# Patient Record
Sex: Female | Born: 1940 | Race: White | Hispanic: No | Smoking: Former smoker
Health system: Southern US, Community
[De-identification: ages and names within clinical notes are randomized; demographics above are authoritative.]

## PROBLEM LIST (undated history)

## (undated) DIAGNOSIS — E039 Hypothyroidism, unspecified: Secondary | ICD-10-CM

## (undated) DIAGNOSIS — M545 Low back pain, unspecified: Secondary | ICD-10-CM

## (undated) DIAGNOSIS — R413 Other amnesia: Principal | ICD-10-CM

## (undated) DIAGNOSIS — L309 Dermatitis, unspecified: Secondary | ICD-10-CM

## (undated) DIAGNOSIS — M25562 Pain in left knee: Secondary | ICD-10-CM

## (undated) DIAGNOSIS — H409 Unspecified glaucoma: Secondary | ICD-10-CM

## (undated) DIAGNOSIS — G43909 Migraine, unspecified, not intractable, without status migrainosus: Secondary | ICD-10-CM

## (undated) HISTORY — DX: Dermatitis, unspecified: L30.9

## (undated) HISTORY — DX: Hypothyroidism, unspecified: E03.9

## (undated) HISTORY — PX: APPENDECTOMY: SHX54

## (undated) HISTORY — DX: Other amnesia: R41.3

## (undated) HISTORY — DX: Low back pain, unspecified: M54.50

## (undated) HISTORY — DX: Low back pain: M54.5

## (undated) HISTORY — DX: Pain in left knee: M25.562

## (undated) HISTORY — DX: Unspecified glaucoma: H40.9

## (undated) HISTORY — DX: Migraine, unspecified, not intractable, without status migrainosus: G43.909

---

## 1975-09-06 HISTORY — PX: TUBAL LIGATION: SHX77

## 1988-09-05 HISTORY — PX: DILATION AND CURETTAGE OF UTERUS: SHX78

## 1989-09-05 HISTORY — PX: OTHER SURGICAL HISTORY: SHX169

## 2013-03-31 ENCOUNTER — Other Ambulatory Visit: Payer: Self-pay

## 2013-03-31 MED ORDER — DONEPEZIL HCL 10 MG PO TABS: 10.0000 mg | ORAL_TABLET | Freq: Every day | ORAL | Status: DC

## 2013-03-31 NOTE — Telephone Encounter (Signed)
Former [facility] patient assigned to Dr Athar.  

## 2013-04-04 ENCOUNTER — Encounter: Payer: Self-pay | Admitting: Neurology

## 2013-04-04 ENCOUNTER — Ambulatory Visit (INDEPENDENT_AMBULATORY_CARE_PROVIDER_SITE_OTHER): Payer: Medicare Other | Admitting: Neurology

## 2013-04-04 VITALS — BP 152/78 | HR 47 | Ht 64.0 in | Wt 134.0 lb

## 2013-04-04 DIAGNOSIS — R413 Other amnesia: Secondary | ICD-10-CM

## 2013-04-04 HISTORY — DX: Other amnesia: R41.3

## 2013-04-04 MED ORDER — DONEPEZIL HCL 10 MG PO TABS: 10.0000 mg | ORAL_TABLET | Freq: Every day | ORAL | Status: DC

## 2013-04-04 NOTE — Patient Instructions (Addendum)
I think overall you are doing fairly well and are stable at this point.   I do have some generic suggestions for you today:  Please make sure that you drink plenty of fluids. I would like for you to exercise daily for example in the form of walking 20-30 minutes every day, if you can. Please keep a regular sleep-wake schedule, keep regular meal times, do not skip any meals, eat  healthy snacks in between meals, such as fruit or nuts. Try to eat protein with every meal.   As far as your medications are concerned, I would like to suggest:    As far as diagnostic testing, I recommend:   Engage in social activities in your community and with your family and try to keep up with current events by reading the newspaper or watching the news.  I do not think we need to make any changes in your medications at this point. Please call us if you have any interim questions, concerns, or problems or updates to need to discuss.  I suggest that you see one of my associates in about 6 months for routine follow up. I will have our nurse, Alverda Skeans, call you for your appointment.  Our phone number is [phone]. We also have an after hours call service for urgent matters and there is a physician on-call for urgent questions. For any emergencies you know to call 911 or go to the nearest emergency room.

## 2013-04-04 NOTE — Progress Notes (Signed)
Subjective:    Patient ID: Leslie Roach is a 72 y.o. female.  HPI  Interim history:   Leslie Roach is a very pleasant 72 year old RH woman, who Presents for followup consultation of her memory loss of over 5 years duration. She is unaccompanied today. She previously followed with Dr. Venia Carbon was last seen by him on 11/13/2012, which time he felt that she was stable she had an MMSE of 30 out of 30 at the time, clock drawing before and animal fluency of 10. She has an underlying medical history of migraine, glaucoma, cataracts, hypothyroidism, eczema, chronic low back pain and left knee pain status post left knee arthroscopic surgery in 1991. She's currently on levothyroxine, Citracal, donepezil, baby aspirin, Lumigan and dorzolamide. Her memory loss started about 5 years ago. 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 She has not had it done yet. She had a near fainting episode while gardening and saw cardiology. She is independent in her activities of daily living. She has no new complaints, she stays active, she lives alone and drives without problems reported. Left knee is much better since her surgery in 1991.   Her Past Medical History Is Significant For: Past Medical History  Diagnosis Date  . Glaucoma   . Hypothyroidism   . Eczema   . Lower back pain   . Left knee pain   . Memory loss 04/04/2013    Her Past Surgical History Is Significant For: Past Surgical History  Procedure Laterality Date  . Dilation and curettage of uterus  1990  . Arthroscopic surgery knee  1991    Her Family History Is Significant For: Family History  Problem Relation Age of Onset  . Cancer Mother   . Cancer Sister   . Cancer Brother     Her Social History Is Significant For: History   Social History  .  Marital Status: Married    Spouse Name: N/A    Number of Children: N/A  . Years of Education: N/A   Social History Main Topics  . Smoking status: None  . Smokeless tobacco: None  . Alcohol Use: None  . Drug Use: None  . Sexually Active: None   Other Topics Concern  . None   Social History Narrative   She lives at home alone.  She is a Engineer, maintenance (IT).  Her husband is deceased.  She has 3 children ,son's 47 and 35 and a daughter 57.  She quit tobacco in 1970.  She consumes 3-4 glasses of red wine per week.  She denies illicit drug use.  She consumes 2  mugs of caffeine in the AM.      Her Allergies Are:  No Known Allergies:   Her Current Medications Are:  Outpatient Encounter Prescriptions as of 04/04/2013  Medication Sig Dispense Refill  . aspirin 81 MG tablet Take 81 mg by mouth daily.      . bimatoprost (LUMIGAN) 0.01 % SOLN 1 drop at bedtime. One drop in each eye once a day      . Calcium Citrate (CITRACAL PO) Take by mouth 2 (two) times daily. Take  2 tabs two times daily      . donepezil (ARICEPT) 10 MG tablet Take 1 tablet (10 mg total) by mouth daily.  30 tablet  0  . dorzolamide (TRUSOPT)  2 % ophthalmic solution Place 1 drop into both eyes 2 (two) times daily.      Marland Kitchen levocetirizine (XYZAL) 5 MG tablet Take 5 mg by mouth daily. Take one tab daily      . levothyroxine (SYNTHROID, LEVOTHROID) 25 MCG tablet Take 25 mcg by mouth daily before breakfast.       No facility-administered encounter medications on file as of 04/04/2013.  : Review of Systems  HENT: Positive for hearing loss.   Eyes:       Blurred Vision  Musculoskeletal:       Joint pain  Allergic/Immunologic: Positive for environmental allergies.  Psychiatric/Behavioral:       Memory Loss,    Objective:  Neurologic Exam  Physical Exam Physical Examination:   Filed Vitals:   04/04/13 0951  BP: 152/78  Pulse: 47    General Examination: The patient is a very pleasant 72 y.o. female in no acute  distress. She is calm and cooperative with the exam. She denies Auditory Hallucinations and Visual Hallucinations.   HEENT: Normocephalic, atraumatic, pupils are equal, round and reactive to light and accommodation. Funduscopic exam is normal with sharp disc margins noted. Extraocular tracking shows no saccadic breakdown without nystagmus noted. Hearing is intact. Tympanic membranes are clear bilaterally. Face is symmetric with no facial masking and normal facial sensation. There is no lip, neck or jaw tremor. Neck is not rigid with intact passive ROM. There are no carotid bruits on auscultation. Oropharynx exam reveals mild mouth dryness. No significant airway crowding is noted. Mallampati is class II. Tongue protrudes centrally and palate elevates symmetrically.    Chest: is clear to auscultation without wheezing, rhonchi or crackles noted.  Heart: sounds are regular and normal without murmurs, rubs or gallops noted.   Abdomen: is soft, non-tender and non-distended with normal bowel sounds appreciated on auscultation.  Extremities: There is trace pitting edema in the distal lower extremities bilaterally. Pedal pulses are intact.  Skin: is warm and dry with no trophic changes noted.  Musculoskeletal: exam reveals no obvious joint deformities, tenderness or joint swelling or erythema.  Neurologically:  Mental status: The patient is awake and alert, paying good  attention. She is able to completely provide the history. She is oriented to: person, place, time/date, situation, day of week, month of year and year. Her memory, attention, language and knowledge are fairly well preserved. There is no aphasia, agnosia, apraxia or anomia. There is a no significant degree of bradyphrenia. Speech is not hypophonic with no dysarthria noted. Mood is congruent and affect is normal.  Her MMSE score is 29/30. CDT is 4/4. AFT (Animal Fluency Test) score is 12.   Cranial nerves are as described above under HEENT  exam. In addition, shoulder shrug is normal with equal shoulder height noted.  Motor exam: Normal bulk, and strength for age is noted. Tone is not rigid with absence of cogwheeling. There is overall no bradykinesia. There is no drift or rebound. There is no tremor.   Romberg is negative. Reflexes are 1+ in the upper extremities and 1+ in the lower extremities. Toes are downgoing bilaterally. Fine motor skills: Finger taps, hand movements, and rapid alternating patting are not impaired bilaterally. Foot taps and foot agility are not impaired bilaterally.   Cerebellar testing shows no dysmetria or intention tremor on finger to nose testing. Heel to shin is unremarkable. There is no truncal or gait ataxia.   Sensory exam is intact to light touch, pinprick, vibration, temperature sense and  proprioception in the upper and lower extremities.   Gait, station and balance: She stands up from the seated position with no difficulty and does not need to push up with her hands and needs no assistance. No veering to one side is noted. No leaning to one side. Posture is age-appropriate. Stance is narrow-based. She turns en bloc. Tandem walk is fair. Balance is preserved.   Assessment and Plan:   In summary, Leslie Roach is a very pleasant 72 y.o.-year old female with a 5 + year history of memory loss. Her history and physical exam are keeping with MCI (mild cognitive impairment). Her physical exam and MMSE/CDT/AFT scores are stable and the disease has not progressed in the last 4+ months. She is doing fairly well at this time and I reassured the patient in that regard.  I had a long chat with the patient about my findings and the diagnosis of memory loss, its prognosis and treatment options. We talked about medical treatments and non-pharmacological approaches. We talked about maintaining a healthy lifestyle in general and staying active mentally and physically. She is encouraged to start doing word puzzles and  to look into [email or web address] and I encouraged the patient to eat healthy, exercise daily and keep well hydrated, to keep a scheduled bedtime and wake time routine, to not skip any meals and eat healthy snacks in between meals and to have protein with every meal. I stressed the importance of regular exercise, within of course the patient's own limitations. I encouraged the patient to keep up with current events by reading the news paper or watching the news.   As far as further diagnostic testing is concerned, I suggested the following: no change. We can repeat neurocognitive testing down the road.  As far as medications are concerned, I recommended the following at this time: no change.  I suggest that she follow up with one of my associates in about 6 months routinely, sooner if the need arises. I will have our nurse, Alverda Skeans, call her for the appointment. I answered all her questions today and the patient was in agreement with the above outlined plan. I encouraged her to call with any interim questions, concerns, problems, updates and refill requests.

## 2013-04-08 ENCOUNTER — Telehealth: Payer: Self-pay | Admitting: Neurology

## 2013-07-06 HISTORY — PX: GLAUCOMA SURGERY: SHX656

## 2013-07-06 HISTORY — PX: CATARACT EXTRACTION: SUR2

## 2013-08-08 ENCOUNTER — Telehealth: Payer: Self-pay | Admitting: Neurology

## 2013-08-08 NOTE — Telephone Encounter (Signed)
Attempted to call pt to r/s 2/12 appt per Dr. Oliva Bustard schedule but pt unable to be reached. Phone kept ringing and no voicemail option.

## 2013-08-12 ENCOUNTER — Telehealth: Payer: Self-pay | Admitting: Neurology

## 2013-08-12 NOTE — Telephone Encounter (Signed)
Spoke with patient and informed to call the cardiologist first since he discont. donezepril because of low heart rate, and that the message will be sent to Dr Frances Furbish for her suggestions,patient verbalized understanding and will call back after speaking with cardiologist.

## 2013-08-13 NOTE — Telephone Encounter (Signed)
Patient is scheduled for holter monitor 12/17, will call back with results

## 2013-10-04 ENCOUNTER — Encounter: Payer: Self-pay | Admitting: Neurology

## 2013-10-17 ENCOUNTER — Encounter (INDEPENDENT_AMBULATORY_CARE_PROVIDER_SITE_OTHER): Payer: Self-pay

## 2013-10-17 ENCOUNTER — Encounter: Payer: Self-pay | Admitting: Neurology

## 2013-10-17 ENCOUNTER — Ambulatory Visit (INDEPENDENT_AMBULATORY_CARE_PROVIDER_SITE_OTHER): Payer: Medicare Other | Admitting: Neurology

## 2013-10-17 VITALS — BP 132/74 | HR 56 | Resp 18 | Ht 64.5 in | Wt 136.0 lb

## 2013-10-17 DIAGNOSIS — F4320 Adjustment disorder, unspecified: Secondary | ICD-10-CM | POA: Insufficient documentation

## 2013-10-17 DIAGNOSIS — F4321 Adjustment disorder with depressed mood: Secondary | ICD-10-CM

## 2013-10-17 NOTE — Progress Notes (Signed)
Subjective:    [name]  Provider:  [name], M D  Referring Provider: [name], MD Primary Care Physician:  [name], MD  Chief Complaint  Patient presents with  . Follow-up    Room 11  . Memory Loss    MMSE- 29/30 AFT - 13    HPI:  Leslie Roach is a 73 y.o. female  Is seen here as a referral/ revisit  from [name] for memory deficits, subjective : The patient again was tested today for her subjective concerned of memory deficits. Since in the past Mini-Mental status examinations have been used to trace her cognitive abilities that this test was repeated today. She scored 29/30 points for order for clock drawing animal fluency test was 12. She is socializing, sleeping well, one of her sons is a physician -- oral, another son lives in [place]. She has good social contacts and a fulfilled social life. The patient lost her husband about 6 years ago and it was in the time after that her sons noticed delay naming and word recall. The patient had no history of any trauma,  prolonged surgery or anesthetic use.  She is a very moderate drinker  @5  glasses of wine per week (at the maximum) , her mother died at age 66 she has a sister that at 49 had no specific memory problems but is obese and has limited mobility. [name] now at 73 years of age has scored higher on her memory testing than she did in 2013, 2012! She is taking generic Aricept daly at 10 mg po since Dr Erling Cruz prescribed this medication in 2010 . Her cardiologist was concerned about bradycardia and discussed a reduction. He would prefer 5 mg daily. The patient wants this addressed.       Last visit history : [name] is a very pleasant 73 year old RH woman, who Presents for followup consultation of her memory loss of over 5 years duration. She is unaccompanied today. She previously followed with [name] was last seen by him on 11/13/2012, which time he felt that she was stable  she had an MMSE of 30 out of 30 at the time, clock drawing before and animal fluency of 10. She has an underlying medical history of migraine, glaucoma, cataracts, hypothyroidism, eczema, chronic low back pain and left knee pain status post left knee arthroscopic surgery in 1991. She's currently on levothyroxine, Citracal, donepezil, baby aspirin, Lumigan and dorzolamide. Her memory loss started about 5 years ago. 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 She has not had it  done yet. She had a near fainting episode while gardening and saw cardiology. She is independent in her activities of daily living. She has no new complaints, she stays active, she lives alone and drives without problems reported. Left knee is much better since her surgery in 1991.      Review of Systems: Out of a complete 14 system review, the patient complains of only the following symptoms, and all other reviewed systems are negative. The patient endorsed a tendency to easily bruised and easily bleed, a complaint of memory loss, delayed naming or word finding. A history of skin sensitivity and allergies occasional rashes and some hearing loss.  History   Social History  . Marital Status: Widowed  Spouse Name: N/A    Number of Children: 3  . Years of Education: college   Occupational History  . Not on file.   Social History Main Topics  . Smoking status: Former Research scientist (life sciences)  . Smokeless tobacco: Never Used     Comment: 1970  . Alcohol Use: Yes     Comment: 3-4 glasses of wine  . Drug Use: No  . Sexual Activity: Not on file   Other Topics Concern  . Not on file   Social History Narrative   She lives at home alone.  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  She has 3 children ,son's 38 and 87 and a daughter 25.  She quit  tobacco in 1970.  She consumes 3-4 glasses of red wine per week.  She denies illicit drug use.  She consumes 2  mugs of caffeine in the AM.      Family History  Problem Relation Age of Onset  . Colon cancer Mother   . Cancer Sister   . Cancer Brother   . Congestive Heart Failure Mother   . Heart Problems Maternal Grandfather   . Heart Problems Maternal Grandmother   . Cancer      paternal siblings  . Cancer Brother     Past Medical History  Diagnosis Date  . Glaucoma   . Hypothyroidism   . Eczema   . Lower back pain   . Left knee pain   . Memory loss 04/04/2013  . Migraine     Past Surgical History  Procedure Laterality Date  . Dilation and curettage of uterus  1990  . Arthroscopic surgery knee  1991  . Tubal ligation  1977  . Appendectomy      childhood  . Cataract extraction  07/2013  . Glaucoma surgery  07/2013    Current Outpatient Prescriptions  Medication Sig Dispense Refill  . aspirin 81 MG tablet Take 81 mg by mouth daily.      . bimatoprost (LUMIGAN) 0.01 % SOLN 1 drop at bedtime. One drop in each eye once a day      . Calcium Citrate (CITRACAL PO) Take by mouth 2 (two) times daily. Take  2 tabs two times daily      . diltiazem (CARDIZEM) 120 MG tablet Take 120 mg by mouth daily.      Marland Kitchen donepezil (ARICEPT) 5 MG tablet Take 5 mg by mouth at bedtime.      . dorzolamide (TRUSOPT) 2 % ophthalmic solution Place 1 drop into both eyes 2 (two) times daily.      Marland Kitchen levocetirizine (XYZAL) 5 MG tablet Take 5 mg by mouth daily. Take one tab daily      . levothyroxine (SYNTHROID, LEVOTHROID) 25 MCG tablet Take 25 mcg by mouth daily before breakfast.       No current facility-administered medications for this visit.    Allergies as of 10/17/2013  . (No Known Allergies)    Vitals: BP 132/74  Pulse 56  Resp 18  Ht 5' 4.5" (1.638 m)  Wt 136 lb (61.689 kg)  BMI 22.99 kg/m2 Last Weight:  Wt Readings from Last 1 Encounters:  10/17/13 136 lb (61.689 kg)   Last  Height:   Ht Readings from Last 1 Encounters:  10/17/13 5' 4.5" (1.638 m)    General: The patient is awake, alert and appears not in acute distress. The patient is well groomed. Head: Normocephalic, atraumatic. Neck is supple. Mallampati 2, neck circumference:13.5  Cardiovascular:  Regular rate and rhythm , without  murmurs or carotid bruit, and without distended neck veins. Respiratory: Lungs are clear to auscultation. Skin:  Without evidence of edema, or rash Trunk: BMI is normal .   Neurologic exam : The patient is awake and alert, oriented to place and time.  Memory subjective  described as "delayed ", which is not reflected in  her MMSE of 29-30 , AFT of 13 and clock of 4/4 .  There is a normal attention span & concentration ability. She is pleasant and cooperative , fully oriented and well informed of daily news.  Speech is fluent without dysarthria, dysphonia or aphasia. Mood and affect are appropriate.  Cranial nerves: Pupils are equal and briskly reactive to light. Funduscopic exam without evidence of pallor or edema. Extraocular movements  in vertical and horizontal planes intact and without nystagmus. Visual fields by finger perimetry are intact. Hearing to finger rub intact.  Facial sensation intact to fine touch. Facial motor strength is symmetric and tongue and uvula move midline.  Motor exam:   Normal tone and  muscle bulk and symmetric strength in all extremities.  Sensory:  Fine touch, pinprick and vibration were tested in all extremities. Proprioception is  normal.  Coordination: Rapid alternating movements in the fingers/hands is tested and normal. Finger-to-nose maneuver tested and normal without evidence of ataxia, dysmetria or tremor.  Gait and station: Patient walks without assistive device  Strength within normal limits. Stance is stable and normal. Tandem gait isunfragmented. Romberg testing: normal.  Deep tendon reflexes: in the  upper and lower extremities are  symmetric and intact. Babinski maneuver downgoing.   Assessment:  After physical and neurologic examination, review of laboratory studies, imaging, neurophysiology testing and pre-existing records, assessment :  Mrs. Heinert is Mini-Mental Status Examination reveals no sign of dementia, at best a mild cognitive impairment today. She scored 29/30 points, for animal fluency test scored 13 and clock drawing is 4/4. I would like in the future for her to undergo a Montral cognitive assessment as which is more detailed and difficult and may be able to differentiate several cognitive domains.  She appears not depressed and indeed her geriatric  depression score is endorsed at 0 point.   Overall I think that we should continue seeing her once a year for a memory test. There is no reason to order any additional laboratory testing and no imaging study is required.  As to the generic Aricept , I see that bradycardia could be an issue : at the patient's last visit listed a pulse rate of 47 beats per minute.  I would like for her to change to aricept  5 mg twice a day about 12 hours apart instead of 10 mg once a day.  This may help her to maintain a pulse rate of 50. The patient   reports no fainting, falling or lightheadedness and has no physical complaints related to bradycardia.   I will encourage her cardiologist, Dr. Agustin Cree ,  to contact me if this is not sufficient in his opinion.  I know of one episode N. 2013 and the patient had felt faint he while gardening, but at that time she was found to be dehydrated in the heat and had also not eaten.   Plan:  Treatment plan and additional workup will be reviewed under Problem List.  MILD COGNITIVE IMPAIRMENT- the conversation rate to dementia is 7-10% per year- the patient has been followed for this for over 4  years .  Continue aricept at 5 mg q 12 hours.

## 2013-12-05 NOTE — Telephone Encounter (Signed)
Closing encounter

## 2014-07-23 ENCOUNTER — Encounter: Payer: Self-pay | Admitting: Neurology

## 2014-10-17 ENCOUNTER — Encounter: Payer: Self-pay | Admitting: Neurology

## 2014-10-17 ENCOUNTER — Ambulatory Visit (INDEPENDENT_AMBULATORY_CARE_PROVIDER_SITE_OTHER): Payer: Medicare Other | Admitting: Neurology

## 2014-10-17 ENCOUNTER — Ambulatory Visit: Payer: Medicare Other | Admitting: Nurse Practitioner

## 2014-10-17 VITALS — BP 125/74 | HR 48 | Resp 14 | Ht 65.25 in | Wt 136.0 lb

## 2014-10-17 DIAGNOSIS — F329 Major depressive disorder, single episode, unspecified: Secondary | ICD-10-CM

## 2014-10-17 DIAGNOSIS — G3184 Mild cognitive impairment, so stated: Secondary | ICD-10-CM

## 2014-10-17 DIAGNOSIS — F32A Depression, unspecified: Secondary | ICD-10-CM

## 2014-10-17 DIAGNOSIS — R5383 Other fatigue: Secondary | ICD-10-CM

## 2014-10-17 NOTE — Progress Notes (Signed)
Subjective:    [name]  Provider:  [name], M D  Referring Provider: [name], MD Primary Care Physician:  [name], MD  Chief Complaint  Patient presents with  . RV cpap    Rm 10, alone    HPI:  Leslie Roach is a 74 y.o. female  Is seen here as a referral/ revisit  from [name] for memory deficits, subjective :  Leslie Roach is a very pleasant 74 year old right handed  Female patient who presents for followup of her memory loss compliant  of over 5 years . She is unaccompanied today. She previously followed with [name] was last seen by him on 11/13/2012, which time he felt that she was stable she had an MMSE of 30 out of 30 at the time, clock drawing before and animal fluency of 10. The patient again was tested 2-12-16for her subjective concerned of memory deficits.  . She scored 29/30 points, on MMSE , for clock drawing  4/4 , animal fluency test was 12.  A MOCA was performed after wards. She scored 23 out of 30 points on this more detailed memory test . Interesting is that she can truly perform well on the most complicated of tasks  (visual spatial and executive function)  as well as attention are excellent.  She did  Fail abt struction, reversing numbers in the right sequence.  But she missed all 5 of the delayed recall words. This is a progression in comparison to her last test.  She is socializing, sleeping well, one of her sons is a physician -- locally in [place],  another son lives in [place]. She has good social contacts and a fulfilled social life. The patient lost her husband about 6 years ago and it was in the time after that her sons noticed delay naming and word recall. The patient had no history of any trauma,  prolonged surgery or anesthetic use. She is a very moderate drinker  @3   glasses of wine per week (at the maximum) ,  her mother died at age 71 she has a sister that at 66 had no specific memory problems but is  obese and has limited mobility. Leslie Roach now at 74 years of age had scored higher on her memory testing in 2015 than she did in 2013, 2012!  She now has the first "slump" while tested onm a MOCA, which is more detailed. She is taking generic Aricept daly at 5 mg po  - Dr Erling Cruz prescribed this medication in 2010 at 10 mg .  Her cardiologist was concerned about bradycardia and discussed a reduction to 5 mg daily, we decided  Today on 5 mg bid.  This is based on a reduction in the score to 23 out of 30 points and is slightly below what is expected for adding's level of education and socioeconomic status. In addition I think that 5 mA twice a day will be easier to tolerate a for bradycardia patient then 10 mg at once I would like her to take 1 in the morning one at night. I wanted to add here that the patient had a CT scan in August 2008 which showed mild cerebral atrophy. She had mild cognitive impairment in her neuropsychological testing battery performed November 2012. And she lives alone she has been driving without problems.    Review of Systems: Out of a complete 14 system review, the patient complains of only the following symptoms, and  all other reviewed systems are negative. The patient endorsed a tendency to easily bruised and easily bleed, a complaint of memory loss, delayed naming or word finding. A history of skin sensitivity and allergies occasional rashes and some hearing loss. She reports no fainting , she takes topical creams for procainamide and ammonium lactate cream and a vitamin D supplement for bone health.  She remains on Synthroid,  Aricept, Cardizem and aspirin, various eye drops.     History   Social History  . Marital Status: Widowed    Spouse Name: N/A  . Number of Children: 3  . Years of Education: college   Occupational History  . Not on file.   Social History Main Topics  . Smoking status: Former Smoker    Quit date: 09/06/1971  . Smokeless tobacco: Never  Used     Comment: 1970  . Alcohol Use: 0.0 oz/week    0 Standard drinks or equivalent per week     Comment: 3-4 glasses of wine/ wk  . Drug Use: No  . Sexual Activity: Not on file   Other Topics Concern  . Not on file   Social History Narrative   She lives at home alone.  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  She has 3 children ,son's 81 and 76 and a daughter 38.  She quit tobacco in 1970.  She consumes 3-4 glasses of red wine per week.  She denies illicit drug use.  She consumes 2  mugs of caffeine in the AM.      Family History  Problem Relation Age of Onset  . Colon cancer Mother   . Cancer Sister   . Cancer Brother   . Congestive Heart Failure Mother   . Heart Problems Maternal Grandfather   . Heart Problems Maternal Grandmother   . Cancer      paternal siblings  . Cancer Brother     Past Medical History  Diagnosis Date  . Glaucoma   . Hypothyroidism   . Eczema   . Lower back pain   . Left knee pain   . Memory loss 04/04/2013  . Migraine     Past Surgical History  Procedure Laterality Date  . Dilation and curettage of uterus  1990  . Arthroscopic surgery knee  1991  . Tubal ligation  1977  . Appendectomy      childhood  . Cataract extraction  07/2013  . Glaucoma surgery  07/2013    Current Outpatient Prescriptions  Medication Sig Dispense Refill  . ammonium lactate (AMLACTIN) 12 % cream Apply topically 2 (two) times daily as needed.   20  . aspirin 81 MG tablet Take 81 mg by mouth daily.    . bimatoprost (LUMIGAN) 0.01 % SOLN 1 drop at bedtime. One drop in each eye once a day    . Calcium Citrate (CITRACAL PO) Take by mouth 2 (two) times daily. Take  2 tabs two times daily    . cholecalciferol (VITAMIN D) 1000 UNITS tablet Take 1,000 Units by mouth daily.    Marland Kitchen diltiazem (CARDIZEM) 120 MG tablet Take 120 mg by mouth daily.    Marland Kitchen donepezil (ARICEPT) 5 MG tablet Take 5 mg by mouth at bedtime.    . dorzolamide (TRUSOPT) 2 % ophthalmic solution  Place 1 drop into both eyes 2 (two) times daily.    Marland Kitchen levocetirizine (XYZAL) 5 MG tablet Take 5 mg by mouth daily. Take one tab daily    .  levothyroxine (SYNTHROID, LEVOTHROID) 25 MCG tablet Take 25 mcg by mouth daily before breakfast.    . UNABLE TO FIND Med Name: Flucinomide cream .05% 2-3 x prn     No current facility-administered medications for this visit.    Allergies as of 10/17/2014  . (No Known Allergies)    Vitals: BP 125/74 mmHg  Pulse 45  Resp 14  Ht 5' 5.25" (1.657 m)  Wt 136 lb (61.689 kg)  BMI 22.47 kg/m2 Last Weight:  Wt Readings from Last 1 Encounters:  10/17/14 136 lb (61.689 kg)   Last Height:   Ht Readings from Last 1 Encounters:  10/17/14 5' 5.25" (1.657 m)    General: The patient is awake, alert and appears not in acute distress. The patient is well groomed. Head: Normocephalic, atraumatic. Neck is supple. Mallampati 2, neck circumference:13.5  Cardiovascular:  Regular rate and rhythm , without  murmurs or carotid bruit, and without distended neck veins. Respiratory: Lungs are clear to auscultation. Skin:  Without evidence of edema, or rash Trunk: BMI is normal.   Neurologic exam : The patient is awake and alert, oriented to place and time.  Memory subjective  described as "delayed ", which is not reflected in  her MMSE of 29-30, MOCA 23 -30 , AFT of 13 and clock of 4/4 .   There is a normal attention span & concentration ability. She has trouble with abstraction, not with trail making. Is able to drive according to her own assessment.   She is pleasant and cooperative , fully oriented and well informed of daily news.  Speech is fluent without dysarthria, dysphonia or aphasia.  Mood and affect are appropriate.  Cranial nerves: Pupils are equal and briskly reactive to light. Extraocular movements in vertical and horizontal planes intact and without nystagmus. Visual fields by finger perimetry are intact. Hearing to finger rub intact. Facial  sensation intact to fine touch. Facial motor strength is symmetric and tongue and uvula move midline.  Motor exam:   Normal tone and  muscle bulk and symmetric strength in all extremities. Sensory:  Fine touch, pinprick and vibration were tested in all extremities. Proprioception is  normal. Coordination: Rapid alternating movements in the fingers/hands is tested and normal. Finger-to-nose maneuver tested and normal without evidence of ataxia, dysmetria or tremor. Gait and station: Patient walks without assistive device  Strength within normal limits..  Deep tendon reflexes: in the  upper and lower extremities are symmetric and intact.    Assessment:  After physical and neurologic examination, review of laboratory studies, imaging, neurophysiology testing and pre-existing records, assessment :  Leslie Roach [facility]   Examination reveals early sign of dementia, . She scored 23/30 points, for animal fluency test scored 13 and clock drawing is 4/4. She has never been evaluated by a MOCA before, her MMSE remained non indicative at 29-30. She is forgetful, but able to balance her checkbook, she feels she overlooks things, has trouble to remember the shopping list.   Overall I think that we should continue seeing her twice  a year for a  [place] memory test. There is no reason to order any additional laboratory testing and but a new  imaging study is order/ required.  As to the generic Aricept , I see that bradycardia could be an issue : at the patient's last visit listed a pulse rate of 47 beats per minute.  I would like for her to change to aricept  5 mg twice a day about 12  hours apart instead of 5 mg once a day.  This may help her to maintain a pulse rate of 50.  It was only 48 bpm today, regular. She reports feeling fatigued and naps now in daytime.  The patient  reports no fainting, falling or lightheadedness and has no physical complaints related to bradycardia. Next visit will need epworth and  FSS> the patient doesn't snore, but lives alone.   I will encourage her cardiologist, Dr. Agustin Cree,  to contact me if this is not sufficient in his opinion-  Please call my office at [phone] or [phone].   Sincerely,   [name], MD

## 2015-04-20 ENCOUNTER — Ambulatory Visit: Payer: Medicare Other | Admitting: Neurology

## 2015-04-27 ENCOUNTER — Ambulatory Visit (INDEPENDENT_AMBULATORY_CARE_PROVIDER_SITE_OTHER): Payer: Medicare Other | Admitting: Neurology

## 2015-04-27 ENCOUNTER — Encounter: Payer: Self-pay | Admitting: Neurology

## 2015-04-27 VITALS — BP 130/80 | HR 68 | Resp 20 | Ht 65.35 in | Wt 132.0 lb

## 2015-04-27 DIAGNOSIS — G3184 Mild cognitive impairment, so stated: Secondary | ICD-10-CM

## 2015-04-27 NOTE — Patient Instructions (Addendum)
Mild Neurocognitive Disorder Mild neurocognitive disorder (formerly known as mild cognitive impairment) is a mental disorder. It is a slight abnormal decrease in mental function. The areas of mental function affected may include memory, thought, communication, behavior, and completion of tasks. The decrease is noticeable and measurable but for the most part does not interfere with your daily activities. Mild neurocognitive disorder typically occurs in people older than 60 years but can occur earlier. It is not as serious as major neurocognitive disorder (formerly known as dementia) but may lead to a more serious neurocognitive disorder. However, in some cases the condition does not get worse. A few people with this disorder even improve. CAUSES  There are a number of different causes of mild neurocognitive disorder:   Brain disorders associated with abnormal protein deposits, such as Alzheimer's disease, Pick's disease, and Lewy body disease.  Brain disorders associated with abnormal movement, such as Parkinson's disease and Huntington's disease.  Diseases affecting blood vessels in the brain and resulting in mini-strokes.  Certain infections, such as human immunodeficiency virus (HIV) infection.  Traumatic brain injury.  Other medical conditions such as brain tumors, underactive thyroid (hypothyroidism), and vitamin B12 deficiency.  Use of certain prescription medicine and "recreational" drugs. SYMPTOMS  Symptoms of mild neurocognitive disorder include:  Difficulty remembering. You may forget details of recent events, names, or phone numbers. You may forget important social events and appointments or repeatedly forget where you put your car keys.  Difficulty thinking and solving problems. You may have trouble with complex tasks such as paying bills or driving in unfamiliar locations.  Difficulty communicating. You may have trouble finding the right word, naming an object, forming a  sentence that makes sense, or understanding what you read or hear.  Changes in your behavior or personality. You may lose interest in the things that you used to enjoy or withdraw from social situations. You may get angry more easily than usual. You may act before thinking. You may do things in public that you would not usually do. You may hear or see things that are not real (hallucinations). You may believe falsely that others are trying to hurt you (paranoia). DIAGNOSIS Mild neurocognitive disorder is diagnosed through an assessment by your health care provider. Your health care provider will ask you and your family, friends, or coworkers questions about your symptoms. He or she will ask how often the symptoms occur, how long they have been occurring, whether they are getting worse, and the effect they are having on your life. Your health care provider may refer you to a neurologist or mental health specialist for a detailed evaluation of your mental functions (neuropsychological testing).  To identify the cause of your mild neurocognitive disorder, your health care provider may:  Obtain a detailed medical history.  Ask about alcohol and drug use, including prescription medicine.  Perform a physical exam.  Order blood tests and brain imaging exams. TREATMENT  Mild neurocognitive disorder caused by infections, use of certain medicines or "recreational" drugs, and certain medical conditions may improve with treatment of the condition that is causing the disorder. Mild neurocognitive disorder resulting from other causes generally does not improve and may worsen. In these cases, the goal of treatment is to slow progression of the disorder and help you cope with the loss of mental function. Treatments in these cases include:   Medicine. Medicine helps mainly with memory loss and behavioral symptoms.   Talk therapy. Talk therapy provides education, emotional support, memory aids, and other   ways of  making up for decreases in mental function.   Lifestyle changes. These include regular exercise, a healthy diet (including essential omega-3 fatty acids), intellectual stimulation, and increased social interaction. Document Released: 04/24/2013 Document Revised: 01/06/2014 Document Reviewed: 04/24/2013 [facility] Patient Information 2015 [place], [place]. This information is not intended to replace advice given to you by your health care provider. Make sure you discuss any questions you have with your health care provider.   Rv with me in 6 month,  CC to [name] and Grisso/

## 2015-04-27 NOTE — Progress Notes (Signed)
Subjective:    Leslie Roach  Provider:  [name], M D  Referring Provider: Raina Roach., MD Primary Care Physician:  Leslie Rile, MD  Chief Complaint  Patient presents with  . Follow-up    memory, rm 27, alone    HPI:  Leslie Roach is a 74 y.o. female  Is seen here as a referral/ revisit  from Leslie Roach for memory deficits, subjective :  Leslie Roach is a very pleasant 74 year old right handed Female, who presents for followup of her memory loss compliant of over 5 years duration  . She is unaccompanied today.  She previously followed with Leslie Roach was last seen by him on 11/13/2012, which time he felt that she was stable she had an MMSE of 30 out of 30 at the time, clock drawing before and animal fluency of 10. The patient again was tested 10-17-14 for her subjective concerned of memory deficits.   She scored 29/30 points, on MMSE , for clock drawing  4/4 , animal fluency test was 12.  A MOCA was performed afterwards.   She scored 23 out of 30 points on this more detailed memory test . Interesting is that she can truly perform well on the most complicated of tasks  (visual spatial and executive function)  as well as attention are excellent.  She did  Fail abt struction, reversing numbers in the right sequence.  But she missed all 5 of the delayed recall words. This is a progression in comparison to her last test.  She is socializing, sleeping well, one of her sons is a physician -- locally in [place],  another son lives in [place]. She has good social contacts and a fulfilled social life. The patient lost her husband about 6 years ago and it was in the time after that her sons noticed delay naming and word recall. The patient had no history of any trauma,  prolonged surgery or anesthetic use. She is a very moderate drinker  @3   glasses of wine per week (at the maximum) ,  her mother died at age 48 she has a sister that at 25 had no specific memory  problems but is obese and has limited mobility.   Leslie Roach now at 74 years of age had scored higher on her memory testing in 2015 than she did in 2013, 2012!   She is taking generic Aricept daly at 5 mg po  - Dr. Erling Roach prescribed this medication in 2010 at 10 mg .  Her cardiologist was concerned about bradycardia and discussed a reduction to 5 mg daily, we decided  Today on 5 mg bid.  This is based on a reduction in the score to 23 out of 30 points and is slightly below what is expected for adding's level of education and socioeconomic status. In addition I think that 5 mA twice a day will be easier to tolerate a for bradycardia patient then 10 mg at once I would like her to take 1 in the morning one at night. I wanted to add here that the patient had a CT scan in August 2008 which showed mild cerebral atrophy. She had mild cognitive impairment in her neuropsychological testing battery performed November 2012. And she lives alone she has been driving without problems.  Interval history from 04-27-15, Mrs. Leslie Roach feels that she has been stable in her memory also she acknowledges she still doesn't feel her memory is the best she does not see  necessarily a decline. Over the last 6 months she felt stable and she again today scored 29 out of 30 points in a Mini-Mental status exam, the [place] cognitive assessment test followed this.  Here she scored 25 out of 30 which is just 1 point below what considered normal. She lost 3 points in the delayed recall of 5 words; she is fully oriented to time and place and date and she missed 1 point in the repeat of a long sentence. Subtraction,  Attention,  Abstraction,  Naming,  drawing a clock, drawing a cube, and the most difficult the trail making test were all without error.   Review of Systems: Out of a complete 14 system review, the patient complains of only the following symptoms, and all other reviewed systems are negative. The patient endorsed a tendency  to easily bruised and easily bleed, a complaint of memory loss, delayed naming or word finding. Her fluency is impaired, in social conversations she often feels frustrated.  history of skin sensitivity and allergies, occasional rashes and some hearing loss. She reports no fainting , no lightheadedness and no near falls.   Social History   Social History  . Marital Status: Widowed    Spouse Name: N/A  . Number of Children: 3  . Years of Education: college   Occupational History  . Not on file.   Social History Main Topics  . Smoking status: Former Smoker    Quit date: 09/06/1971  . Smokeless tobacco: Never Used     Comment: 1970  . Alcohol Use: 0.0 oz/week    0 Standard drinks or equivalent per week     Comment: 3-4 glasses of wine/ wk  . Drug Use: No  . Sexual Activity: Not on file   Other Topics Concern  . Not on file   Social History Narrative   She lives at home alone.  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  She has 3 children ,son's 70 and 29 and a daughter 57.  She quit tobacco in 1970.  She consumes 3-4 glasses of red wine per week.  She denies illicit drug use.  She consumes 2  mugs of caffeine in the AM.      Family History  Problem Relation Age of Onset  . Colon cancer Mother   . Cancer Sister   . Cancer Brother   . Congestive Heart Failure Mother   . Heart Problems Maternal Grandfather   . Heart Problems Maternal Grandmother   . Cancer      paternal siblings  . Cancer Brother     Past Medical History  Diagnosis Date  . Glaucoma   . Hypothyroidism   . Eczema   . Lower back pain   . Left knee pain   . Memory loss 04/04/2013  . Migraine     Past Surgical History  Procedure Laterality Date  . Dilation and curettage of uterus  1990  . Arthroscopic surgery knee  1991  . Tubal ligation  1977  . Appendectomy      childhood  . Cataract extraction  07/2013  . Glaucoma surgery  07/2013    Current Outpatient Prescriptions  Medication  Sig Dispense Refill  . ammonium lactate (AMLACTIN) 12 % cream Apply topically 2 (two) times daily as needed.   20  . aspirin 81 MG tablet Take 81 mg by mouth daily.    . bimatoprost (LUMIGAN) 0.01 % SOLN 1 drop at bedtime. One drop in each  eye once a day    . Calcium Citrate (CITRACAL PO) Take by mouth 2 (two) times daily. Take  2 tabs two times daily    . cholecalciferol (VITAMIN D) 1000 UNITS tablet Take 1,000 Units by mouth daily.    Marland Kitchen diltiazem (CARDIZEM) 120 MG tablet Take 120 mg by mouth daily.    Marland Kitchen donepezil (ARICEPT) 5 MG tablet Take 5 mg by mouth 2 (two) times daily.    . dorzolamide (TRUSOPT) 2 % ophthalmic solution Place 1 drop into both eyes 2 (two) times daily.    Marland Kitchen levocetirizine (XYZAL) 5 MG tablet Take 5 mg by mouth daily. Take one tab daily    . levothyroxine (SYNTHROID, LEVOTHROID) 25 MCG tablet Take 25 mcg by mouth daily before breakfast.    . UNABLE TO FIND Med Name: Flucinomide cream .05% 2-3 x prn     No current facility-administered medications for this visit.    Allergies as of 04/27/2015  . (No Known Allergies)    Vitals: BP 130/80 mmHg  Pulse 68  Resp 20  Ht 5' 5.35" (1.66 m)  Wt 132 lb (59.875 kg)  BMI 21.73 kg/m2 Last Weight:  Wt Readings from Last 1 Encounters:  04/27/15 132 lb (59.875 kg)   Last Height:   Ht Readings from Last 1 Encounters:  04/27/15 5' 5.35" (1.66 m)    General: The patient is awake, alert and appears not in acute distress. The patient is well groomed. Head: Normocephalic, atraumatic. Neck is supple. Mallampati 2, neck circumference:13.5 , no goiter,  Cardiovascular:  Regular rate and rhythm , without  murmurs or carotid bruit, and without distended neck veins. Respiratory: Lungs are clear to auscultation. Skin:  Without evidence of edema, or rash Trunk: BMI is normal.   Neurologic exam : The patient is awake and alert, oriented to place and time.  Memory subjective  described as "delayed ", which is not reflected in  her  MMSE of 29-30, MOCA 25 -30 , AFT of 13 and clock of 4/4 . In February 2016 her MOCA was 23-30 . F word fluency was 22, she acd the trail making, clock drawing and cube copy test. Lost 3 out of 5 words in recall,  and one point when she couldn't repeat  a sentence.  There is a normal attention span & concentration ability. She has trouble with abstraction, not with trail making. Is able to drive according to her own assessment.   She is pleasant and cooperative , fully oriented and well informed of daily news.  Speech is fluent without dysarthria, dysphonia or aphasia.  Mood and affect are appropriate.  Cranial nerves:  She reports no loss of smell or taste , pupils are equal and briskly reactive to light. Extraocular movements in vertical and horizontal planes intact and without nystagmus. Hearing to finger rub intact. Facial sensation intact to fine touch. Facial motor strength is symmetric and tongue and uvula move midline. Equal shoulder movement .  Motor exam:   Normal tone and  muscle bulk and symmetric strength in all extremities. Good bilateral grip and pinch strength.  Sensory:  Fine touch, pinprick and vibration were tested in all extremities. Proprioception is  normal. Coordination: Rapid alternating movements in the fingers/hands is tested and normal. Finger-to-nose maneuver tested and normal without evidence of ataxia, dysmetria or tremor. Gait and station: Patient walks without assistive device  Strength within normal limits..  Deep tendon reflexes: in the  upper and lower extremities are symmetric and intact.  Assessment:  After physical and neurologic examination, review of laboratory studies, imaging, neurophysiology testing and pre-existing records, assessment :  Mrs. Tirpak new [facility]   Examination reveals early sign of dementia, . She scored 23/30 points, for animal fluency test scored 13 and clock drawing is 4/4. She has never been evaluated by a MOCA before, her MMSE  remained non indicative at 29-30. She is forgetful, but able to balance her checkbook, she feels she overlooks things, has trouble to remember the shopping list.   Overall I think that we should continue seeing her twice a year for a  [place] memory test. 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   No  additional laboratory testing or imaging study is  required. pulse rate of 47 beats per minute- I would like for her to continue Aricept  5 mg twice a day about 12 hours apart instead of 10 mg once a day.  This may help her to maintain a pulse rate of near 50.  It was only 48 bpm today, regular.   Again, she reports feeling fatigued and naps in daytime. She reports her nocturnal sleep is restorative and sound. The patient  reports no fainting, falling or lightheadedness and has no physical complaints related to bradycardia.  RV in 5 -6 month with me   Sincerely,   [name], MD

## 2015-10-28 ENCOUNTER — Encounter: Payer: Self-pay | Admitting: Neurology

## 2015-10-28 ENCOUNTER — Ambulatory Visit (INDEPENDENT_AMBULATORY_CARE_PROVIDER_SITE_OTHER): Payer: Medicare Other | Admitting: Neurology

## 2015-10-28 ENCOUNTER — Telehealth: Payer: Self-pay | Admitting: Neurology

## 2015-10-28 VITALS — BP 132/62 | HR 58 | Resp 20 | Ht 65.0 in | Wt 129.0 lb

## 2015-10-28 DIAGNOSIS — F0390 Unspecified dementia without behavioral disturbance: Secondary | ICD-10-CM

## 2015-10-28 MED ORDER — MEMANTINE HCL 10 MG PO TABS: 10.0000 mg | ORAL_TABLET | Freq: Two times a day (BID) | ORAL | Status: DC

## 2015-10-28 MED ORDER — MEMANTINE HCL 28 X 5 MG & 21 X 10 MG PO TABS: ORAL_TABLET | ORAL | Status: DC

## 2015-10-28 NOTE — Telephone Encounter (Signed)
Leslie Roach with [facility] called said  memantine (NAMENDA TITRATION PAK) tablet pack is not covered under pt insurance. Sts she could make a titration pak for the pt and the insurance would pay for the loose tablets. Please call at [phone], she will be there until 7pm today.

## 2015-10-28 NOTE — Patient Instructions (Signed)
Memantine Tablets What is this medicine? MEMANTINE (MEM an teen) is used to treat dementia caused by Alzheimer's disease. This medicine may be used for other purposes; ask your health care provider or pharmacist if you have questions. What should I tell my health care provider before I take this medicine? They need to know if you have any of these conditions: -difficulty passing urine -kidney disease -liver disease -seizures -an unusual or allergic reaction to memantine, other medicines, foods, dyes, or preservatives -pregnant or trying to get pregnant -breast-feeding How should I use this medicine? Take this medicine by mouth with a glass of water. Follow the directions on the prescription label. You may take this medicine with or without food. Take your doses at regular intervals. Do not take your medicine more often than directed. Continue to take your medicine even if you feel better. Do not stop taking except on the advice of your doctor or health care professional. Talk to your pediatrician regarding the use of this medicine in children. Special care may be needed. Overdosage: If you think you have taken too much of this medicine contact a poison control center or emergency room at once. NOTE: This medicine is only for you. Do not share this medicine with others. What if I miss a dose? If you miss a dose, take it as soon as you can. If it is almost time for your next dose, take only that dose. Do not take double or extra doses. If you do not take your medicine for several days, contact your health care provider. Your dose may need to be changed. What may interact with this medicine? -acetazolamide -amantadine -cimetidine -dextromethorphan -dofetilide -hydrochlorothiazide -ketamine -metformin -methazolamide -quinidine -ranitidine -sodium bicarbonate -triamterene This list may not describe all possible interactions. Give your health care provider a list of all the medicines,  herbs, non-prescription drugs, or dietary supplements you use. Also tell them if you smoke, drink alcohol, or use illegal drugs. Some items may interact with your medicine. What should I watch for while using this medicine? Visit your doctor or health care professional for regular checks on your progress. Check with your doctor or health care professional if there is no improvement in your symptoms or if they get worse. You may get drowsy or dizzy. Do not drive, use machinery, or do anything that needs mental alertness until you know how this drug affects you. Do not stand or sit up quickly, especially if you are an older patient. This reduces the risk of dizzy or fainting spells. Alcohol can make you more drowsy and dizzy. Avoid alcoholic drinks. What side effects may I notice from receiving this medicine? Side effects that you should report to your doctor or health care professional as soon as possible: -allergic reactions like skin rash, itching or hives, swelling of the face, lips, or tongue -agitation or a feeling of restlessness -depressed mood -dizziness -hallucinations -redness, blistering, peeling or loosening of the skin, including inside the mouth -seizures -vomiting Side effects that usually do not require medical attention (report to your doctor or health care professional if they continue or are bothersome): -constipation -diarrhea -headache -nausea -trouble sleeping This list may not describe all possible side effects. Call your doctor for medical advice about side effects. You may report side effects to FDA at 1-800-FDA-1088. Where should I keep my medicine? Keep out of the reach of children. Store at room temperature between 15 degrees and 30 degrees C (59 degrees and 86 degrees F). Throw away any   unused medicine after the expiration date. NOTE: This sheet is a summary. It may not cover all possible information. If you have questions about this medicine, talk to your doctor,  pharmacist, or health care provider.    2016, Elsevier/Gold Standard. (2013-06-10 14:10:42)  

## 2015-10-28 NOTE — Progress Notes (Signed)
Subjective:    [name]  Provider:  [name], M D  Referring Provider: [name], MD Primary Care Physician:  [name], MD  Chief Complaint  Patient presents with  . Follow-up    memory, rm 36, with son    HPI:  Leslie Roach is a 75 y.o. female  Is seen here as a referral/ revisit  from [name] for memory deficits, progressive .  Ms. Roach is a very pleasant 75 year old right handed Female, who presents for followup of her memory complaint of over 5 years duration  . She is accompanied today by her son, Dr Lovette Cliche. .  She previously followed with [name] was last seen by him on 11/13/2012, which time he felt that she was stable she had an MMSE of 30 out of 30 at the time, clock drawing before and animal fluency of 10. The patient again was tested 10-17-14 for her subjective concerned of memory deficits.   She scored 29/30 points on MMSE , for clock drawing  4/4 , animal fluency test was 12.  [place] Cognitive Assessment  10/28/2015 04/27/2015 10/17/2014  Visuospatial/ Executive (0/5) 5 5 5   Naming (0/3) 3 3 3   Attention: Read list of digits (0/2) 2 1 -  Attention: Read list of letters (0/1) 1 1 -  Attention: Serial 7 subtraction starting at 100 (0/3) 3 3 -  Language: Repeat phrase (0/2) 1 1 -  Language : Fluency (0/1) 1 1 -  Abstraction (0/2) 1 2 -  Delayed Recall (0/5) 0 2 0  Orientation (0/6) 6 6 -  Total 23 25 -  Adjusted Score (based on education) 23 25 -   10-28-15 MOCA excellent still 23-30, slight decline. Her son and daughter in law feel she is worse. She feels more fatigued. Has had an abnormal protein-electrophoresis , M spike was noted, referral to oncologist is done,appointment is pending.  She is  She has good social contacts and a fulfilled social life. The patient lost her husband about 6 years ago and it was in the time after that her sons noticed delay naming and word recall. The patient had no history of  any trauma,  prolonged surgery or anesthetic use. She is a very moderate drinker  @3   glasses of wine per week (at the maximum) ,  her mother died at age 66 she has a sister that at 10 had no specific memory problems but is obese and has limited mobility. Leslie Roach now at 75 years of age had scored higher on her memory testing in 2015 than she did in 2013, 2012!   [name] prescribed this medication in 2010 at 10 mg . Her cardiologist was concerned about bradycardia and discussed a reduction to 5 mg daily, we decided today on 5 mg bid. She continues to eat well, keep active but has restricted her driving to residential and in daytime. She has gotten lost on a way to [place], on [address] , took the wrong exit.   Review of Systems: Out of a complete 14 system review, the patient complains of only the following symptoms, and all other reviewed systems are negative. The patient endorsed a tendency to easily bruised and easily bleed, a complaint of memory loss,  delayed naming or word finding. Her fluency is impaired, in social conversations she often feels frustrated.  history of skin sensitivity and allergies, occasional rashes and some hearing loss. She reports no fainting ,  no lightheadedness and no near falls. Short term memory loss .  Social History   Social History  . Marital Status: Widowed    Spouse Name: N/A  . Number of Children: 3  . Years of Education: college   Occupational History  . Not on file.   Social History Main Topics  . Smoking status: Former Smoker    Quit date: 09/06/1971  . Smokeless tobacco: Never Used     Comment: 1970  . Alcohol Use: 0.0 oz/week    0 Standard drinks or equivalent per week     Comment: 3-4 glasses of wine/ wk  . Drug Use: No  . Sexual Activity: Not on file   Other Topics Concern  . Not on file   Social History Narrative   She lives at home alone.  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  She has 3 children ,son's 38 and 54  and a daughter 35.  She quit tobacco in 1970.  She consumes 3-4 glasses of red wine per week.  She denies illicit drug use.  She consumes 2  mugs of caffeine in the AM.      Family History  Problem Relation Age of Onset  . Colon cancer Mother   . Cancer Sister   . Cancer Brother   . Congestive Heart Failure Mother   . Heart Problems Maternal Grandfather   . Heart Problems Maternal Grandmother   . Cancer      paternal siblings  . Cancer Brother     Past Medical History  Diagnosis Date  . Glaucoma   . Hypothyroidism   . Eczema   . Lower back pain   . Left knee pain   . Memory loss 04/04/2013  . Migraine     Past Surgical History  Procedure Laterality Date  . Dilation and curettage of uterus  1990  . Arthroscopic surgery knee  1991  . Tubal ligation  1977  . Appendectomy      childhood  . Cataract extraction  07/2013  . Glaucoma surgery  07/2013   BP 132/62 mmHg  Pulse 58  Resp 20  Ht 5\' 5"  (1.651 m)  Wt 129 lb (58.514 kg)  BMI 21.47 kg/m2 Last Weight:  Wt Readings from Last 1 Encounters:  10/28/15 129 lb (58.514 kg)   Last Height:   Ht Readings from Last 1 Encounters:  10/28/15 5\' 5"  (1.651 m)    General: The patient is awake, alert and appears not in acute distress. The patient is well groomed. Head: Normocephalic, atraumatic. Neck is supple. Mallampati 2, neck [id number] , no goiter,  Click at right TMJ . Cardiovascular: Regular rate and rhythm, without murmurs or carotid bruit, and without distended neck veins. Respiratory: Lungs are clear to auscultation. Skin:  Without evidence of edema, or rash. Trunk: BMI is normal.  Neurologic exam : The patient is awake and alert, oriented to place and time. Memory subjective  described as "delayed ", which is not reflected in  her MMSE of 29-30, MOCA 25 -30 , AFT of 13 and clock of 4/4 . In February 2016 her MOCA was 23-30 .  F word fluency was 22, she did well on the trail making,  clock drawing and cube  copy test. Lost 3 out of 5 words in recall, and one point when she couldn't repeat  a sentence.There is a normal attention span & concentration ability. She has trouble with abstraction, not with trail making. Is able  to drive according to her own assessment. She is pleasant and cooperative , fully oriented and well informed of daily news. Speech is fluent without dysarthria, dysphonia or aphasia. Mood and affect are appropriate.  Cranial nerves: She reports no loss of smell or taste , pupils are equal and briskly reactive to light. Extraocular movements in vertical and horizontal planes intact and without nystagmus. Hearing to finger rub intact. Facial sensation intact to fine touch. Facial motor strength is symmetric and tongue and uvula move midline. Equal shoulder movement . Motor exam:  Normal tone and  muscle bulk and symmetric strength in all extremities. Good bilateral grip and pinch strength.  Sensory:  Fine touch, pinprick and vibration were tested in all extremities. Proprioception is  normal. Coordination: Rapid alternating movements in the fingers/hands is tested and normal. Finger-to-nose maneuver tested and normal without evidence of ataxia, dysmetria or tremor. Gait and station: Patient walks without assistive device  Strength within normal limits.. Deep tendon reflexes: in the  upper and lower extremities are symmetric and intact.   Assessment:  After physical and neurologic examination, review of laboratory studies, imaging, neurophysiology testing and pre-existing records, assessment :  Leslie Roach new [place]:  Examination reveals early sign of dementia, . She scored 23/30 points, for animal fluency test scored 13 and clock drawing is 4/4. She has  been evaluated by a MOCA before, her score was 23-30. She is forgetful, but able to balance her checkbook, she feels she overlooks things, has trouble to remember the shopping list, has been insecure in driving..   Overall, I think that we  should continue seeing her twice a year for a  MOCA memory test. 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  No additional laboratory testing or imaging study is  required.Pulse rate of 47 beats per minute- I would like for her to continue Aricept  5 mg twice a day about 12 hours apart instead of 10 mg once a day. She will add Namenda. This may help her to maintain a pulse rate of near 50.  It was only 48 bpm today, regular. Again, she reports feeling fatigued and naps in daytime. She reports her nocturnal sleep is restorative and sound.The patient  reports no fainting, falling or lightheadedness and has no physical complaints related to bradycardia.  RV in 5 -6 month with me   Sincerely,   [name], MD

## 2015-10-28 NOTE — Telephone Encounter (Signed)
Spoke to Dr. Brett Fairy, she is ok with the pharmacy dispensing 5 mg of the memantine with the titration instructions since pt's insurance will not cover the titration pack. I spoke with Colletta Maryland who verified that she will use 5 mg tablets per the titration instructions, and the end goal is namenda 10 mg by mouth BID.

## 2015-11-02 DIAGNOSIS — Z87891 Personal history of nicotine dependence: Secondary | ICD-10-CM | POA: Diagnosis not present

## 2015-11-02 DIAGNOSIS — Z800 Family history of malignant neoplasm of digestive organs: Secondary | ICD-10-CM | POA: Diagnosis not present

## 2015-11-02 DIAGNOSIS — Z803 Family history of malignant neoplasm of breast: Secondary | ICD-10-CM | POA: Diagnosis not present

## 2015-11-02 DIAGNOSIS — D472 Monoclonal gammopathy: Secondary | ICD-10-CM | POA: Diagnosis not present

## 2015-11-02 DIAGNOSIS — Z807 Family history of other malignant neoplasms of lymphoid, hematopoietic and related tissues: Secondary | ICD-10-CM | POA: Diagnosis not present

## 2015-11-17 DIAGNOSIS — D472 Monoclonal gammopathy: Secondary | ICD-10-CM

## 2016-02-15 ENCOUNTER — Other Ambulatory Visit: Payer: Self-pay

## 2016-02-15 DIAGNOSIS — F0390 Unspecified dementia without behavioral disturbance: Secondary | ICD-10-CM

## 2016-02-15 MED ORDER — MEMANTINE HCL 10 MG PO TABS: 10.0000 mg | ORAL_TABLET | Freq: Two times a day (BID) | ORAL | Status: DC

## 2016-02-16 NOTE — Telephone Encounter (Signed)
RX for memantine signed by Dr. Brett Fairy, faxed to [facility]. Received a receipt of confirmation.

## 2016-02-24 ENCOUNTER — Ambulatory Visit (INDEPENDENT_AMBULATORY_CARE_PROVIDER_SITE_OTHER): Payer: Medicare Other | Admitting: Neurology

## 2016-02-24 ENCOUNTER — Encounter: Payer: Self-pay | Admitting: Neurology

## 2016-02-24 ENCOUNTER — Telehealth: Payer: Self-pay | Admitting: Neurology

## 2016-02-24 VITALS — BP 140/68 | HR 50 | Resp 20 | Ht 65.0 in | Wt 141.0 lb

## 2016-02-24 DIAGNOSIS — F0280 Dementia in other diseases classified elsewhere without behavioral disturbance: Secondary | ICD-10-CM

## 2016-02-24 DIAGNOSIS — M3500 Sicca syndrome, unspecified: Secondary | ICD-10-CM | POA: Diagnosis not present

## 2016-02-24 DIAGNOSIS — G301 Alzheimer's disease with late onset: Secondary | ICD-10-CM | POA: Diagnosis not present

## 2016-02-24 MED ORDER — B COMPLEX-C-BIOTIN-E-FA 0.4 MG PO TABS: ORAL_TABLET | ORAL | Status: DC

## 2016-02-24 NOTE — Telephone Encounter (Signed)
I spoke to pt's son, Zakiyya Hypes (per [facility]). I advised him that I spoke to Dr. Brett Fairy and she is ok with the pt having her PET scan on 7/12. I advised pt's son that the appt will be for 9 am but check in at 8:30. Pt's son verbalized understanding.  Pt's son says that he will call back tomorrow and verify if this will work for his mother. I asked him to please speak to Danielle to confirm this appt.

## 2016-02-24 NOTE — Patient Instructions (Addendum)
Further evaluation for possible sicca syndrome as the patient has dry eyes, but the strong tearing, dry mouth and dry skin. She already has a biotin supplement available which I would like her to take twice a day 80 at the same time she takes her Aricept. In addition I would like her ophthalmologist to do a Sherman test. I hope that her cardiologist is happy with the maintained bradycardia at about 50 bpm the presenile. Aricept 5 mm twice a day seems not to affect her as much as a 10 mg pill daily. I like for her to be socially interactive as possible she has restricted her driving she still physically active Princess Bruins has a lot of hobbies. All this is very important to maintain cognitive abilities. Because she has been a decrease in her scores on memory tests over the last 3 years I will now order an MRI with a metabolic scan. If this can be performed in  close to her home and would be beneficial for the patient.Management of Memory Problems  There are some general things you can do to help manage your memory problems.  Your memory may not in fact recover, but by using techniques and strategies you will be able to manage your memory difficulties better.  1)  Establish a routine.  Try to establish and then stick to a regular routine.  By doing this, you will get used to what to expect and you will reduce the need to rely on your memory.  Also, try to do things at the same time of day, such as taking your medication or checking your calendar first thing in the morning.  Think about think that you can do as a part of a regular routine and make a list.  Then enter them into a daily planner to remind you.  This will help you establish a routine.  2)  Organize your environment.  Organize your environment so that it is uncluttered.  Decrease visual stimulation.  Place everyday items such as keys or cell phone in the same place every day (ie.  Basket next to front door)  Use post it notes with a brief  message to yourself (ie. Turn off light, lock the door)  Use labels to indicate where things go (ie. Which cupboards are for food, dishes, etc.)  Keep a notepad and pen by the telephone to take messages  3)  Memory Aids  A diary or journal/notebook/daily planner  Making a list (shopping list, chore list, to do list that needs to be done)  Using an alarm as a reminder (kitchen timer or cell phone alarm)  Using cell phone to store information (Notes, Calendar, Reminders)  Calendar/White board placed in a prominent position  Post-it notes  In order for memory aids to be useful, you need to have good habits.  It's no good remembering to make a note in your journal if you don't remember to look in it.  Try setting aside a certain time of day to look in journal.  4)  Improving mood and managing fatigue.  There may be other factors that contribute to memory difficulties.  Factors, such as anxiety, depression and tiredness can affect memory.  Regular gentle exercise can help improve your mood and give you more energy.  Simple relaxation techniques may help relieve symptoms of anxiety  Try to get back to completing activities or hobbies you enjoyed doing in the past.  Learn to pace yourself through activities to decrease fatigue.  Find  out about some local support groups where you can share experiences with others. Try and achieve 7-8 hours of sleep at night.

## 2016-02-24 NOTE — Progress Notes (Signed)
Subjective:    [name]  Provider:  [name], M D  Referring Provider: [name], MD Primary Care Physician:  [name], MD  Chief Complaint  Patient presents with  . Follow-up    memory, son reports that pt got locked out of the house, rm 10, alone    HPI:  Leslie Roach is a 75 y.o. female  Is seen here as a referral/ revisit  from [name] for memory deficits, progressive over the last 2 years  .  Leslie Roach is a very pleasant 75 year old right handed Female,  who presents for followup of her memory complaint of over 5 years duration .  Leslie Roach is accompanied today by her son, Dr Lovette Cliche. .  Leslie Roach previously followed with [name] was last seen by him on 11/13/2012, which time he felt that Leslie Roach was stable Leslie Roach had an MMSE of 30 out of 30 at the time, clock drawing before and animal fluency of 10.  Leslie Roach got lost on her way during to [facility] , and now has locked herself out of her house twice.  Her sons cell phone number is stored on her hers, and Leslie Roach could call for help.   Montreal Cognitive Assessment  02/24/2016 10/28/2015 04/27/2015 10/17/2014  Visuospatial/ Executive (0/5) 4 5 5 5   Naming (0/3) 3 3 3 3   Attention: Read list of digits (0/2) 2 2 1  -  Attention: Read list of letters (0/1) 1 1 1  -  Attention: Serial 7 subtraction starting at 100 (0/3) 1 3 3  -  Language: Repeat phrase (0/2) 1 1 1  -  Language : Fluency (0/1) 1 1 1  -  Abstraction (0/2) 1 1 2  -  Delayed Recall (0/5) 0 0 2 0  Orientation (0/6) 6 6 6  -  Total 20 23 25  -  Adjusted Score (based on education) 20 23 25  -     The patient again was tested 10-17-14 for her subjective concerned of memory deficits. Leslie Roach scored 29/30 points on MMSE , for clock drawing  4/4 , animal fluency test was 12.  10-28-15  MOCA excellent still 23-30, slight decline. Her son and daughter in law feel Leslie Roach is worse. Leslie Roach feels more fatigued. Has had an abnormal protein-electrophoresis , M  spike was noted, referral to oncologist is done,appointment is pending.  Leslie Roach has good social contacts and a full social life, remains active . The patient lost her husband about 7 years ago and it was in the time after that her sons noticed delay naming and word recall. The patient had no history of any trauma,  prolonged surgery or anesthetic use. Leslie Roach is a very moderate drinker  @3   glasses of wine per week (at the maximum) ,  Her mother died at age 32 , Leslie Roach has a sister that at 54 had no specific memory problems but the sister is obese and has limited mobility.Leslie Roach now at 75 years of age had scored higher on her memory testing in 2015 than Leslie Roach did in 2013, 2012!  [name] prescribed this medication in 2010 at 10 mg . Her cardiologist was concerned about bradycardia and discussed a reduction to 5 mg daily, we decided today on 5 mg bid.  Leslie Roach continues to eat well, keep active but has restricted her driving to residential and in daytime.  Leslie Roach has gotten lost on a way to [place], on [facility] [address] , took the wrong exit when Leslie Roach wanted to  visit her daughter. .   Review of Systems: Out of a complete 14 system review, the patient complains of only the following symptoms, and all other reviewed systems are negative.  Her fluency is impaired, in social conversations Leslie Roach often feels frustrated.  history of skin sensitivity and allergies, occasional rashes and some hearing loss. Leslie Roach reports no fainting , no lightheadedness and no near falls. Short term memory loss .  Social History   Social History  . Marital Status: Widowed    Spouse Name: N/A  . Number of Children: 3  . Years of Education: college   Occupational History  . Not on file.   Social History Main Topics  . Smoking status: Former Smoker    Quit date: 09/06/1971  . Smokeless tobacco: Never Used     Comment: 1970  . Alcohol Use: 0.0 oz/week    0 Standard drinks or equivalent per week     Comment: 3-4 glasses of wine/ wk  . Drug Use:  No  . Sexual Activity: Not on file   Other Topics Concern  . Not on file   Social History Narrative   Leslie Roach lives at home alone.  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  Leslie Roach has 3 children ,son's 75 and 61 and a daughter 60.  Leslie Roach quit tobacco in 1970.  Leslie Roach consumes 3-4 glasses of red wine per week.  Leslie Roach denies illicit drug use.  Leslie Roach consumes 2  mugs of caffeine in the AM.      Family History  Problem Relation Age of Onset  . Colon cancer Mother   . Cancer Sister   . Cancer Brother   . Congestive Heart Failure Mother   . Heart Problems Maternal Grandfather   . Heart Problems Maternal Grandmother   . Cancer      paternal siblings  . Cancer Brother     Past Medical History  Diagnosis Date  . Glaucoma   . Hypothyroidism   . Eczema   . Lower back pain   . Left knee pain   . Memory loss 04/04/2013  . Migraine     Past Surgical History  Procedure Laterality Date  . Dilation and curettage of uterus  1990  . Arthroscopic surgery knee  1991  . Tubal ligation  1977  . Appendectomy      childhood  . Cataract extraction  07/2013  . Glaucoma surgery  07/2013   BP 140/68 mmHg  Pulse 50  Resp 20  Ht 5\' 5"  (1.651 m)  Wt 141 lb (63.957 kg)  BMI 23.46 kg/m2 Last Weight:  Wt Readings from Last 1 Encounters:  02/24/16 141 lb (63.957 kg)   Last Height:   Ht Readings from Last 1 Encounters:  02/24/16 5\' 5"  (1.651 m)    General: The patient is awake, alert and appears not in acute distress. The patient is well groomed. Head: Normocephalic, atraumatic. Neck is supple. Mallampati 2, neck [id number] , no goiter,  Click at right TMJ . Cardiovascular: Regular rate and rhythm, without murmurs or carotid bruit, and without distended neck veins. Respiratory: Lungs are clear to auscultation. Skin:  Without evidence of edema, or rash. Trunk: BMI is normal.  Neurologic exam : The patient is awake and alert, oriented to place and time. Memory subjective  described as  "delayed ", which is not reflected in  her MMSE of 29-30, MOCA 25 -30 , AFT of 13 and clock of 4/4 . In February 2016 her  MOCA was 23-30 .  F word fluency was 22, Leslie Roach did well on the trail making,  clock drawing and cube copy test. Lost 3 out of 5 words in recall, and one point when Leslie Roach couldn't repeat  a sentence.There is a normal attention span & concentration ability. Leslie Roach has trouble with abstraction, not with trail making. Is able to drive according to her own assessment. Leslie Roach is pleasant and cooperative , fully oriented and well informed of daily news. Speech is fluent without dysarthria, dysphonia or aphasia. Mood and affect are appropriate.  Cranial nerves: Leslie Roach reports no loss of smell or taste , pupils are equal and briskly reactive to light. Extraocular movements in vertical and horizontal planes intact and without nystagmus. Hearing to finger rub intact. Facial sensation intact to fine touch. Facial motor strength is symmetric and tongue and uvula move midline. Equal shoulder movement . Motor exam:  Normal tone and  muscle bulk and symmetric strength in all extremities. Good bilateral grip and pinch strength.  Sensory:  Fine touch, pinprick and vibration were tested in all extremities. Proprioception is  normal. Coordination: Rapid alternating movements in the fingers/hands is tested and normal. Finger-to-nose maneuver tested and normal without evidence of ataxia, dysmetria or tremor. Gait and station: Patient walks without assistive device  Strength within normal limits.. Deep tendon reflexes: in the  upper and lower extremities are symmetric and intact.   Assessment:  After physical and neurologic examination, review of laboratory studies, imaging, neurophysiology testing and pre-existing records, assessment :  Leslie Roach new [place]:  Examination reveals early sign of dementia, . Leslie Roach scored  20-30,  Leslie Roach has been evaluated by a MOCA before, her score was 23-30. Leslie Roach is forgetful, but able to  balance her checkbook, Leslie Roach feels Leslie Roach overlooks things, has trouble to remember the shopping list, has been insecure in driving. Leslie Roach only does restricted driving now, Leslie Roach has good and bad days, something her son has confirmed. No Parkinsonism, no hallucinations. Uses a hearing aid now- and complies with medication intake. Bradycardia has not let to fainting, can be attributed to Aricept. 5 mg bid usually gentler on her hear rate.   Overall, I think that we should continue seeing her twice a year for a  MOCA memory test.  This visit lasted 25 minutes, and more than 50%of the face to face time are dedicated to the memory loss Leslie Roach presents with  short term memory loss, now level of  dementia, rather than MCI.   No additional laboratory testing  Required. Pulse rate of 50  beats per minute- I would like for her to continue Aricept  5 mg twice a day about 12 hours apart instead of 10 mg once a day. Leslie Roach will add Namenda. This may help her to maintain a pulse rate of near 50. Again, Leslie Roach reports feeling fatigued and naps in daytime. Naps are refreshing.  Leslie Roach reports her nocturnal sleep is restorative and sound.The patient  reports no fainting, falling or lightheadedness and has no physical complaints related to bradycardia.  I will order a PET scan for metabolic brain function to verify that we are not dealing with a different form of dementia.   RV in 2-3  month with me !     Sincerely,   [name], MD

## 2016-02-24 NOTE — Telephone Encounter (Signed)
Spoke with the patient son and informed him the first available apt was June 28th @ 6:00am. He wanted me to consult with Dr. Brett Fairy as to rather his mother could wait or it was an urgent matter. The next available apt would be July 12th. He says they will go to the June 28th apt if that's what Dr. Brett Fairy feels is best but they wouldn't mind scheduling it for a later time if that is an option.

## 2016-02-26 ENCOUNTER — Telehealth: Payer: Self-pay | Admitting: Neurology

## 2016-02-26 NOTE — Telephone Encounter (Signed)
Pt's son called said he is confirming the head scan on 7/12 @ 9am in [place]. I made him aware Andee Poles is not in the office today but I would send the message to her and she will get it on Monday when she returns. Please call to lock in this date and time. He said as long as it is 9am and no later he will be ok to get her there.

## 2016-02-29 NOTE — Telephone Encounter (Signed)
Spoke with the patient's son and confirmed the apt. Patient has been sent to [facility] for scan.

## 2016-03-14 ENCOUNTER — Encounter: Payer: Self-pay | Admitting: Neurology

## 2016-03-22 ENCOUNTER — Telehealth: Payer: Self-pay

## 2016-03-22 NOTE — Telephone Encounter (Signed)
Received the results of pt's PET scan, 03/16/2016.  Impression: 1. No decreased parietal cortical metabolism to identified Alzheimer's type dementia. 2. Subtle decreased frontal cortical metabolism and atrophy. 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 3. Normal occipital lobe activity.   Dr. Brett Fairy, what would you like me to tell the pt regarding these results?

## 2016-03-22 NOTE — Telephone Encounter (Signed)
I spoke to pt's son, Dr. Lin Landsman, per [facility]. I advised him that pt's PET scan was reviewed by Dr. Brett Fairy and she wanted pt to know that the results were not typical for alzheimer's findings but was suggestive of frontal lobe changes and Dr. Brett Fairy wants to follow up clinically. PT already has an appt on 04/27/16 and pt's son said she will keep that appt. Pt's son verbalized understanding of results. Pt had no questions at this time but was encouraged to call back if questions arise.

## 2016-03-22 NOTE — Telephone Encounter (Signed)
Not typical for aAzheimer's findings. Suggestive of frontal lobe changes. Will need clinically to follow up. CD You may call Dr. Lin Landsman , he handles her medical affairs. CD

## 2016-04-27 ENCOUNTER — Encounter: Payer: Self-pay | Admitting: Neurology

## 2016-04-27 ENCOUNTER — Ambulatory Visit (INDEPENDENT_AMBULATORY_CARE_PROVIDER_SITE_OTHER): Payer: Medicare Other | Admitting: Neurology

## 2016-04-27 DIAGNOSIS — G3109 Other frontotemporal dementia: Secondary | ICD-10-CM

## 2016-04-27 DIAGNOSIS — F0280 Dementia in other diseases classified elsewhere without behavioral disturbance: Secondary | ICD-10-CM | POA: Diagnosis not present

## 2016-04-27 MED ORDER — DONEPEZIL HCL 10 MG PO TABS: ORAL_TABLET | ORAL | 3 refills | Status: DC

## 2016-04-27 NOTE — Progress Notes (Signed)
Subjective:    [name]  Provider:  [name], M D  Referring Provider: [name], MD Primary Care Physician:  [name], MD  Chief Complaint  Patient presents with  . Follow-up    PET scan results    HPI:  Leslie Roach is a 75 y.o. female  Is seen here as a referral/ revisit  from [name] for memory deficits, progressive over the last 2 years  .  Leslie Roach is a very pleasant 75 year old right handed Female,  who presents for followup of her memory complaint of over 5 years duration .  She is accompanied today by her son, Dr Lovette Cliche. .  She previously followed with [name] was last seen by him on 11/13/2012, which time he felt that she was stable she had an MMSE of 30 out of 30 at the time, clock drawing before and animal fluency of 10.  She got lost on her way during to [facility] , and now has locked herself out of her house twice.  Her sons cell phone number is stored on her hers, and she could call for help.  Leslie Roach underwent a PET scan or so-called metabolic PET area skin on 04/21/2016, it documented mild frontal atrophy no cortical infarction, no mass lesion and minimal decreased metabolism in the frontal lobe areas nor decreased cortical metabolism in the parietal lobes.  This scan does not identify Alzheimer's Type dementia but points towards a frontal cortical dementia .There was normal occipital lobe activity  Montreal Cognitive Assessment  04/27/2016 02/24/2016 10/28/2015 04/27/2015 10/17/2014  Visuospatial/ Executive (0/5) 4 4 5 5 5   Naming (0/3) 3 3 3 3 3   Attention: Read list of digits (0/2) 2 2 2 1  -  Attention: Read list of letters (0/1) 1 1 1 1  -  Attention: Serial 7 subtraction starting at 100 (0/3) 2 1 3 3  -  Language: Repeat phrase (0/2) 1 1 1 1  -  Language : Fluency (0/1) 1 1 1 1  -  Abstraction (0/2) 2 1 1 2  -  Delayed Recall (0/5) 1 0 0 2 0  Orientation (0/6) 6 6 6 6  -  Total 23 20 23 25  -   Adjusted Score (based on education) 23 20 23 25  -       HIP:  10-2015  MOCA excellent still 23-30, slight decline. Her son and daughter in law feel she is worse. She feels more fatigued. Has had an abnormal protein-electrophoresis , M spike was noted, referral to oncologist is done,appointment is pending.  She has good social contacts and a full social life, remains active . The patient lost her husband about 7 years ago and it was in the time after that her sons noticed delay naming and word recall. The patient had no history of any trauma,  prolonged surgery or anesthetic use. She is a very moderate drinker  @3   glasses of wine per week (at the maximum) ,  Her mother died at age 36 , she has a sister that at 43 had no specific memory problems but the sister is obese and has limited mobility.Leslie Roach now at 75 years of age had scored higher on her memory testing in 2015 than she did in 2013, 2012!  [name] prescribed this medication in 2010 at 10 mg . Her cardiologist was concerned about bradycardia and discussed a reduction to 5 mg daily, we decided today on 5 mg bid.  She  continues to eat well, keep active but has restricted her driving to residential and in daytime.  She has gotten lost on a way to [place], on [address] , took the wrong exit when she wanted to visit her daughter. Leslie Roach new MOCA:  Examination reveals early sign of dementia, . She scored  20-30,  She has been evaluated by a MOCA before, her score was 23-30. She is forgetful, but able to balance her checkbook, she feels she overlooks things, has trouble to remember the shopping list, has been insecure in driving. She only does restricted driving now, she has good and bad days, something her son has confirmed. No Parkinsonism, no hallucinations. Uses a hearing aid now- and complies with medication intake. Bradycardia has not let to fainting, can be attributed to Aricept. 5 mg bid usually gentler on her hear rate.   Review of  Systems: Out of a complete 14 system review, the patient complains of only the following symptoms, and all other reviewed systems are negative.  Her word fluency is impaired, in social conversations she often feels frustrated. She reports memory loss, short term.  history of skin sensitivity and allergies, occasional rashes and some hearing loss. She reports no fainting , no lightheadedness and no near falls.   Social History   Social History  . Marital status: Widowed    Spouse name: N/A  . Number of children: 3  . Years of education: college   Occupational History  . Not on file.   Social History Main Topics  . Smoking status: Former Smoker    Quit date: 09/06/1971  . Smokeless tobacco: Never Used     Comment: 1970  . Alcohol use 0.0 oz/week     Comment: 3-4 glasses of wine/ wk  . Drug use: No  . Sexual activity: Not on file   Other Topics Concern  . Not on file   Social History Narrative   She lives at home alone.  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  She has 3 children ,son's 70 and 69 and a daughter 66.  She quit tobacco in 1970.  She consumes 3-4 glasses of red wine per week.  She denies illicit drug use.  She consumes 2  mugs of caffeine in the AM.      Family History  Problem Relation Age of Onset  . Colon cancer Mother   . Cancer Sister   . Cancer Brother   . Congestive Heart Failure Mother   . Heart Problems Maternal Grandfather   . Heart Problems Maternal Grandmother   . Cancer      paternal siblings  . Cancer Brother     Past Medical History:  Diagnosis Date  . Eczema   . Glaucoma   . Hypothyroidism   . Left knee pain   . Lower back pain   . Memory loss 04/04/2013  . Migraine     Past Surgical History:  Procedure Laterality Date  . APPENDECTOMY     childhood  . arthroscopic surgery knee  1991  . CATARACT EXTRACTION  07/2013  . DILATION AND CURETTAGE OF UTERUS  1990  . GLAUCOMA SURGERY  07/2013  . TUBAL LIGATION  1977   BP  140/80   Pulse (!) 48   Resp 20   Ht 5\' 5"  (1.651 m)   Wt 137 lb (62.1 kg)   BMI 22.80 kg/m  Last Weight:  Wt Readings from Last 1 Encounters:  04/27/16 137  lb (62.1 kg)   Last Height:   Ht Readings from Last 1 Encounters:  04/27/16 5\' 5"  (1.651 m)    General: The patient is awake, alert and appears not in acute distress. The patient is well groomed. Head: Normocephalic, atraumatic. Neck is supple. Mallampati 2, neck Q000111Q , no goiter,  Click at right TMJ . Cardiovascular: Regular rate and rhythm, without murmurs or carotid bruit, and without distended neck veins. Respiratory: Lungs are clear to auscultation. Skin:  Without evidence of edema, or rash. Trunk: BMI is normal.  Neurologic exam : There is a normal attention span & concentration ability. She has trouble with abstraction, not with trail making. Is able to drive according to her own assessment. She is pleasant and cooperative , fully oriented and well informed of daily news. Speech is fluent without dysarthria, dysphonia or aphasia. Mood and affect are appropriate. Cranial nerves: She reports no loss of smell or taste , pupils are equal and briskly reactive to light. Extraocular movements in vertical and horizontal planes intact and without nystagmus. Hearing to finger rub intact. Facial sensation intact to fine touch. Facial motor strength is symmetric and tongue and uvula move midline. Equal shoulder movement . Motor exam:  Normal tone and  muscle bulk and symmetric strength in all extremities. Good bilateral grip and pinch strength.  Sensory:  Fine touch, pinprick and vibration were tested in all extremities. Proprioception is  normal. Coordination: Rapid alternating movements in the fingers/hands is tested and normal. Finger-to-nose maneuver tested and normal without evidence of ataxia, dysmetria or tremor. Gait and station: Patient walks without assistive device  Strength within normal limits.. Deep tendon  reflexes: in the  upper and lower extremities are symmetric and intact.   Assessment:  After physical and neurologic examination, review of laboratory studies, imaging, neurophysiology testing and pre-existing records, assessment :  Frustration, not agitation. No anger or impulse control issues.  Aricept to continue.  Print guidelines for fronto temporal dementia, the diagnosis by PET.    Sincerely,   [name], MD

## 2016-04-27 NOTE — Patient Instructions (Signed)
FRONTO TEMPORAL DEMENTIA  This disease is a  progressive brain disorder. In Pick disease, a loss of brain cells and a buildup of abnormal proteins inside brain cells cause changes in behavior and speech. Over time, the disease causes the frontal and temporal anterior lobes of the brain to shrink. These are the parts of the brain that control behaviors and speech.  Pick disease is one of the most common causes of progressive mental disability in people younger than 60 years. The disease progresses faster in some people than in others.  CAUSES  The exact cause of Pick disease is not known. Some genetic changes in chromosomes have been linked to Pick disease. The disease tends to run in families. Family members of people with Pick disease should consider genetic counseling. SIGNS AND SYMPTOMS  Signs and symptoms of Pick disease usually start between the ages of 72 years and 28 years. Behavior symptoms may include:  Impulsive, poorly controlled, inappropriate, or embarrassing behavior.  Lack of motivation and interest (apathy).  Irritability and agitation.  Neglect of personal hygiene.  Withdrawal.  Repetitive behaviors.  Impulsive eating.  Lack of concern for others.  Failure to recognize behavioral changes.  Inability to speak.  Inability to write or read.  Slurred speech.  Loss of vocabulary. DIAGNOSIS  Your health care provider may suspect Pick disease if you have progressive behavior or speech difficulties. You may need to see specialists in brain and behavioral health. The following tests may be done:  Blood tests to rule out other causes, like vitamin deficiency, harmful effects of substances (toxicities), and infections.  Spinal tap (lumbar puncture) to check spinal fluid samples for abnormal proteins.  Image studies (especially MRI) to take pictures of the brain. These can show brain changes that suggest Pick disease or another brain disorder.  In rare cases, removal of  a piece of brain tissue to examine under a microscope (biopsy). This is the most accurate way to diagnose Pick disease. TREATMENT  There is no cure for Pick disease. No treatments can slow the progression of the disease. Support at home is the most important aspect of managing Pick disease. Ask about caregiving resources in the community. Other aspects of management include:  Antidepressants to help with apathy.  Sedative drugs to control aggressive or dangerous behavior.  Occupational therapy to help with home safety and activity.  Speech and language therapy.  Behavioral therapy.  Institutional care, at some point, if needed. HOME CARE INSTRUCTIONS Changes you can make at home that may help you manage Pick disease include:  Keeping a regular routine.  Avoiding stress or surprises.  Avoiding activities that trigger aggressive behavior.  Following a healthy diet.  Avoiding excess sugar in the diet.  Watching for signs of compulsive eating, which can lead to other health problems.  Avoiding alcohol.  Scheduling regular, enjoyable, and supervised exercise. SEEK MEDICAL CARE IF:  Symptoms change or get worse.  It becomes more difficult or stressful to be cared for at home. SEEK IMMEDIATE MEDICAL CARE IF:  It is no longer possible to be cared for at home.  It is dangerous to be cared for at home.   This information is not intended to replace advice given to you by your health care provider. Make sure you discuss any questions you have with your health care provider.   Document Released: 08/12/2002 Document Revised: 08/27/2013 Document Reviewed: 08/05/2013 Elsevier Interactive Patient Education [employer].

## 2016-05-19 DIAGNOSIS — D472 Monoclonal gammopathy: Secondary | ICD-10-CM

## 2016-10-31 ENCOUNTER — Ambulatory Visit (INDEPENDENT_AMBULATORY_CARE_PROVIDER_SITE_OTHER): Payer: Medicare Other | Admitting: Neurology

## 2016-10-31 ENCOUNTER — Encounter: Payer: Self-pay | Admitting: Neurology

## 2016-10-31 VITALS — BP 122/68 | HR 62 | Resp 14 | Ht 65.0 in | Wt 146.0 lb

## 2016-10-31 DIAGNOSIS — F0390 Unspecified dementia without behavioral disturbance: Secondary | ICD-10-CM | POA: Diagnosis not present

## 2016-10-31 MED ORDER — DONEPEZIL HCL 10 MG PO TABS: ORAL_TABLET | ORAL | 3 refills | Status: DC

## 2016-10-31 MED ORDER — MEMANTINE HCL 10 MG PO TABS: 10.0000 mg | ORAL_TABLET | Freq: Two times a day (BID) | ORAL | 5 refills | Status: DC

## 2016-10-31 NOTE — Progress Notes (Signed)
Subjective:    [name]  Provider:  [name], M D  Referring Provider: [name], MD Primary Care Physician:  [name], MD  Chief Complaint  Patient presents with  . Follow-up    Rm 10. No new concerns per patient.     HPI:  Leslie Roach is a 76 y.o. female  Is seen here as a referral/ revisit  from [name] for memory deficits, progressive over the last 2 years  .  I have the pleasure of seeing Leslie Roach and [name] here today on 10/31/2016 in a revisit.  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 We amnestic pattern of her impairments continues to provide concerned for the presence of possible Alzheimer's disease, the patient has a bachelor's degree in psychology, she also talked for part of her adult life. The overall performance across cognitive domains was variable with several areas of note. Complex attention, multitasking and set shifting did not provide difficulties that that did not in a previous neuropsychological evaluation. She performs in the average range on simple attention, visual motor sequencing, confrontation naming, semantic fluency is preserved and she is very good at visual /construction. Her performance is above average of let to fluency inlet of fluency and superior and working memory.  She has been getting lost !  She gave up driving except on Sundays for church , her home is only 500 feet form her sons, church is 1/5 miles. The psychological testing battery confirms her day to day difficulties as she described.   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   MMSE 22/30.   Montreal Cognitive Assessment  10/31/2016 04/27/2016 02/24/2016 10/28/2015 04/27/2015  Visuospatial/ Executive (0/5) 5 4 4 5 5   Naming (0/3) 3 3 3 3 3   Attention: Read list of digits  (0/2) 2 2 2 2 1   Attention: Read list of letters (0/1) 1 1 1 1 1   Attention: Serial 7 subtraction starting at 100 (0/3) 2 2 1 3 3   Language: Repeat phrase (0/2) 1 1 1 1 1   Language : Fluency (0/1) 1 1 1 1 1   Abstraction (0/2) 2 2 1 1 2   Delayed Recall (0/5) 0 1 0 0 2  Orientation (0/6) 5 6 6 6 6   Total 22 23 20 23 25   Adjusted Score (based on education) 22 23 20 23 25        Last visit note:  Leslie Roach is a very pleasant 76 year old right handed Female,  who presents for followup of her memory complaint of over 5 years duration .  She is accompanied today by her son, Dr Lovette Cliche. .  She previously followed with [name] was last seen by him on 11/13/2012, which time he felt that she was stable she had an MMSE of 30 out of 30 at the time, clock drawing before and animal fluency of 10.  She got lost on her way during to [facility] , and now has locked herself out of her house twice.  Her sons cell phone number is stored on her hers, and she could call for help.  [name] underwent a PET scan or so-called metabolic PET area skin on 04/21/2016, it documented mild frontal atrophy no cortical infarction, no mass lesion and minimal decreased metabolism in the frontal lobe areas nor decreased cortical metabolism in the parietal lobes.  This scan does not identify Alzheimer's Type dementia but points towards a frontal cortical dementia .There was normal occipital lobe activity.      HIP:  10-2015  MOCA excellent still 23-30, slight decline. Her son and daughter in law feel she is worse. She feels more fatigued. Has had an abnormal protein-electrophoresis , M spike was noted, referral to oncologist is done,appointment is pending.  She has good social contacts and a full social life, remains active . The patient lost her husband about 7 years ago and it was in the time after that her sons noticed delay naming and word recall. The patient had no history of any trauma,  prolonged  surgery or anesthetic use. She is a very moderate drinker  @3   glasses of wine per week (at the maximum) ,  Her mother died at age 2 , she has a sister that at 26 had no specific memory problems but the sister is obese and has limited mobility.Leslie Roach now at 76 years of age had scored higher on her memory testing in 2015 than she did in 2013, 2012!  [name] prescribed this medication in 2010 at 10 mg . Her cardiologist was concerned about bradycardia and discussed a reduction to 5 mg daily, we decided today on 5 mg bid.  She continues to eat well, keep active but has restricted her driving to residential and in daytime.  She has gotten lost on a way to [place], on [address] , took the wrong exit when she wanted to visit her daughter. [name] new MOCA:  Examination reveals early sign of dementia, . She scored  20-30,  She has been evaluated by a MOCA before, her score was 23-30. She is forgetful, but able to balance her checkbook, she feels she overlooks things, has trouble to remember the shopping list, has been insecure in driving. She only does restricted driving now, she has good and bad days, something her son has confirmed. No Parkinsonism, no hallucinations. Uses a hearing aid now- and complies with medication intake. Bradycardia has not let to fainting, can be attributed to Aricept. 5 mg bid usually gentler on her hear rate.   Review of Systems: Out of a complete 14 system review, the patient complains of only the following symptoms, and all other reviewed systems are negative.  Her word fluency is impaired, in social conversations she often feels frustrated. She reports memory loss, short term.  history of skin sensitivity and allergies, occasional rashes and some hearing loss. She reports no fainting , no lightheadedness and no near falls.   Social History   Social History  . Marital status: Widowed    Spouse name: N/A  . Number of children: 3  . Years of education: college    Occupational History  . Not on file.   Social History Main Topics  . Smoking status: Former Smoker    Quit date: 09/06/1971  . Smokeless tobacco: Never Used     Comment: 1970  . Alcohol use 0.0 oz/week     Comment: 3-4 glasses of wine/ wk  . Drug use: No  . Sexual activity: Not on file   Other Topics Concern  . Not on file   Social History Narrative   She lives at home alone.  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  She has 3 children ,son's 19 and 30 and a daughter 68.  She quit tobacco in 1970.  She consumes 3-4 glasses of red  wine per week.  She denies illicit drug use.  She consumes 2  mugs of caffeine in the AM.      Family History  Problem Relation Age of Onset  . Colon cancer Mother   . Cancer Sister   . Cancer Brother   . Congestive Heart Failure Mother   . Heart Problems Maternal Grandfather   . Heart Problems Maternal Grandmother   . Cancer      paternal siblings  . Cancer Brother     Past Medical History:  Diagnosis Date  . Eczema   . Glaucoma   . Hypothyroidism   . Left knee pain   . Lower back pain   . Memory loss 04/04/2013  . Migraine     Past Surgical History:  Procedure Laterality Date  . APPENDECTOMY     childhood  . arthroscopic surgery knee  1991  . CATARACT EXTRACTION  07/2013  . DILATION AND CURETTAGE OF UTERUS  1990  . GLAUCOMA SURGERY  07/2013  . TUBAL LIGATION  1977   BP 122/68   Pulse 62   Resp 14   Ht 5\' 5"  (1.651 m)   Wt 146 lb (66.2 kg)   BMI 24.30 kg/m  Last Weight:  Wt Readings from Last 1 Encounters:  10/31/16 146 lb (66.2 kg)   Last Height:   Ht Readings from Last 1 Encounters:  10/31/16 5\' 5"  (1.651 m)    General: The patient is awake, alert and appears not in acute distress. The patient is well groomed. Head: Normocephalic, atraumatic. Neck is supple. Mallampati 2, neck Q000111Q , no goiter,  Click at right TMJ . Cardiovascular: Regular rate and rhythm, without murmurs or carotid bruit,  and without distended neck veins. Respiratory: Lungs are clear to auscultation. Skin:  Without evidence of edema, or rash. Trunk: BMI is normal.  Neurologic exam : There is a normal attention span & concentration ability. She has trouble with abstraction, not with trail making. Is able to drive according to her own assessment. She is pleasant and cooperative , fully oriented and well informed of daily news. Speech is fluent without dysarthria, dysphonia or aphasia. Mood and affect are appropriate. Cranial nerves: She reports no loss of smell or taste , pupils are equal and briskly reactive to light. Extraocular movements in vertical and horizontal planes intact and without nystagmus. Hearing to finger rub intact. Facial sensation intact to fine touch. Facial motor strength is symmetric and tongue and uvula move midline. Equal shoulder movement . Motor exam:  Normal tone and  muscle bulk and symmetric strength in all extremities. Good bilateral grip and pinch strength.  Sensory:  Fine touch, pinprick and vibration were tested in all extremities. Proprioception is  normal. Coordination: Rapid alternating movements in the fingers/hands is tested and normal. Finger-to-nose maneuver tested and normal without evidence of ataxia, dysmetria or tremor. Gait and station: Patient walks without assistive device  Strength within normal limits.. Deep tendon reflexes: in the  upper and lower extremities are symmetric and intact.   Assessment:  After physical and neurologic examination, review of laboratory studies, imaging, neurophysiology testing and pre-existing records, assessment :  Frustration, not agitation. No anger or impulse control issues.  Aricept to continue.  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  I think Alzheimer's is possible, too but  less likely    Sincerely,   [name], MD

## 2016-10-31 NOTE — Patient Instructions (Signed)
Dementia Introduction Dementia means losing some of your brain ability. People with dementia may have problems with:  Memory.  Making decisions.  Behavior.  Speaking.  Thinking.  Solving problems. Follow these instructions at home: Medicine  Take over-the-counter and prescription medicines only as told by your doctor.  Avoid taking medicines that can change how you think. These include pain or sleeping medicines. Lifestyle  Make healthy choices:  Be active as told by your doctor.  Do not use any tobacco products, such as cigarettes, chewing tobacco, and e-cigarettes. If you need help quitting, ask your doctor.  Eat a healthy diet.  When you get stressed, do something to help yourself relax. Your doctor can give you tips.  Spend time with other people.  Drink enough fluid to keep your pee (urine) clear or pale yellow.  Make sure you get good sleep. Use these tips to help you get a good night's rest:  Try not to take naps during the day.  Keep your sleeping area dark and cool.  In the few hours before you go to bed, try not to do any exercise.  Try not to have foods and drinks with caffeine in the evening. General instructions  Talk with your doctor to figure out:  What you need help with.  What your safety needs are.  If you were given a bracelet that tracks your location, make sure to wear it.  Keep all follow-up visits as told by your doctor. This is important. Contact a doctor if:  You have any new problems.  You have problems with choking or swallowing.  You have any symptoms of a different sickness. Get help right away if:  You have a fever.  You feel mixed up (confused) or more mixed up than before.  You have new sleepiness.  You have sleepiness that gets worse.  You have a hard time staying awake.  You or your family members are worried for your safety. This information is not intended to replace advice given to you by your health  care provider. Make sure you discuss any questions you have with your health care provider. Document Released: 08/04/2008 Document Revised: 01/28/2016 Document Reviewed: 05/20/2015  2017 Elsevier

## 2016-11-16 DIAGNOSIS — D472 Monoclonal gammopathy: Secondary | ICD-10-CM

## 2017-04-24 ENCOUNTER — Encounter: Payer: Self-pay | Admitting: Neurology

## 2017-04-24 ENCOUNTER — Ambulatory Visit (INDEPENDENT_AMBULATORY_CARE_PROVIDER_SITE_OTHER): Payer: Medicare Other | Admitting: Neurology

## 2017-04-24 VITALS — BP 108/62 | HR 50 | Ht 66.0 in | Wt 147.0 lb

## 2017-04-24 DIAGNOSIS — F0280 Dementia in other diseases classified elsewhere without behavioral disturbance: Secondary | ICD-10-CM | POA: Diagnosis not present

## 2017-04-24 DIAGNOSIS — G308 Other Alzheimer's disease: Secondary | ICD-10-CM

## 2017-04-24 DIAGNOSIS — W19XXXA Unspecified fall, initial encounter: Secondary | ICD-10-CM

## 2017-04-24 NOTE — Progress Notes (Signed)
Subjective:    [name]  Provider:  [name], M D  Referring Provider: [name], MD Primary Care Physician:  [name], MD  Chief Complaint  Patient presents with  . Follow-up    pt with daughter, just recently had a fall and slight break. memory worsening per daughter.Marland Kitchen     HPI:  Leslie Roach is a 76 y.o. female  Is seen here as a referral/ revisit  from [name] for memory deficits, progressive over the last 2. 5 years  .  Interval history from 04/24/2017, I have the pleasure of meeting Leslie Roach today in the presence of her daughter, the family has noted progressive short-term memory loss and aphagia, difficulties with word finding. For the first time today we have a Montral cognitive assessment was less than 20 points and the would follow from now on with a Mini-Mental Status Examination, which is considered a more basic test. I noted very fragmented sentences. She fell , she reported :" I turned too quick in my home ", she broke her right foot.  She now cannot longer drive , based on MOCA and clinical observation.    I have the pleasure of seeing Leslie Roach and [name] here today on 10/31/2016 in a revisit.  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 We amnestic pattern of her impairments continues to provide concerned for the presence of possible Alzheimer's disease, the patient has a bachelor's degree in psychology, she also talked for part of her adult life. The overall performance across cognitive domains was variable with several areas of note. Complex attention, multitasking and set shifting did not provide difficulties that that did not in a previous neuropsychological evaluation. She performs in the average range on simple attention, visual motor  sequencing, confrontation naming, semantic fluency is preserved and she is very good at visual /construction. Her performance is above average of let to fluency inlet of fluency and superior and working memory.  She has been getting lost !  She gave up driving except on Sundays for church , her home is only 500 feet form her sons, church is 1/5 miles. The psychological testing battery confirms her day to day difficulties as she described.   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   MMSE 22/30.   Montreal Cognitive Assessment  04/24/2017 10/31/2016 04/27/2016 02/24/2016 10/28/2015  Visuospatial/ Executive (0/5) 4 5 4 4 5  Naming (0/3) 3 3 3 3 3  Attention: Read list of digits (0/2) 1 2 2 2 2  Attention: Read list of letters (0/1) 0 1 1 1 1  Attention: Serial 7 subtraction starting at 100 (0/3) 2 2 2 1 3  Language: Repeat phrase (0/2) 0 1 1 1 1  Language : Fluency (0/1) 1 1 1 1 1  Abstraction (0/2) 2 2 2 1 1  Delayed Recall (0/5) 0 0 1 0 0  Orientation (0/6) 4 5 6 6 6  Total 17 22 23 20 23  Adjusted Score (based on education) - 22 23 20 23      02 -2017 Leslie Roach is a very pleasant 76 year old right handed Female,  who presents for followup of her memory complaint of over 5 years duration .  She is accompanied today by her son, Dr Lovette Cliche. .  She previously followed with [name] was  last seen by him on 11/13/2012, which time he felt that she was stable she had an MMSE of 30 out of 30 at the time, clock drawing before and animal fluency of 10. She got lost on her way during to [facility] in September 2016 , and now has locked herself out of her house twice. Her sons cell phone number is stored on her hers, and she could call for help.   Leslie Roach underwent a PET scan or so-called metabolic PET area skin on 04/21/2016, it documented mild frontal atrophy no cortical infarction, no mass lesion and minimal decreased metabolism in the frontal lobe areas nor decreased cortical metabolism in the  parietal lobes.  This scan does not identify Alzheimer's Type dementia but points towards a frontal cortical dementia .There was normal occipital lobe activity.    HIP:  10-2015  MOCA excellent still 23-30, slight decline. Her son and daughter in law feel she is worse. She feels more fatigued. Has had an abnormal protein-electrophoresis , M spike was noted, referral to oncologist is done,appointment is pending. She has good social contacts and a full social life, remains active . The patient lost her husband about 7 years ago and it was in the time after that her sons noticed delay naming and word recall. The patient had no history of any trauma,  prolonged surgery or anesthetic use. She is a very moderate drinker  @3   glasses of wine per week (at the maximum) ,  Her mother died at age 56 , she has a sister that at 42 had no specific memory problems but the sister is obese and has limited mobility. Leslie Roach now at 76 years of age had scored higher on her memory testing in 2015 than she did in 2013, 2012!  [name] prescribed this medication in 2010 at 10 mg . Her cardiologist was concerned about bradycardia and discussed a reduction to 5 mg daily, we decided today on 5 mg bid.  She continues to eat well, keep active but has restricted her driving to residential and in daytime.  She has gotten lost on a way to [place], on [address] , took the wrong exit when she wanted to visit her daughter. Leslie Roach new MOCA:  Examination reveals early sign of dementia, . She scored  20-30,  She has been evaluated by a MOCA before, her score was 23-30. She is forgetful, but able to balance her checkbook, she feels she overlooks things, has trouble to remember the shopping list, has been insecure in driving.She only does restricted driving now, she has good and bad days, something her son has confirmed.  No Parkinsonism, no hallucinations. Uses a hearing aid now- and complies with medication intake. Bradycardia has not  let to fainting, can be attributed to Aricept. 5 mg bid usually gentler on her hear rate.   Review of Systems: Out of a complete 14 system review, the patient complains of only the following symptoms, and all other reviewed systems are negative.  Her word fluency is impaired, in social conversations she often feels frustrated. She reports memory loss, short term.  history of skin sensitivity and allergies, occasional rashes and some hearing loss. She reports no fainting , no lightheadedness and no near falls.   Social History   Social History  . Marital status: Widowed    Spouse name: N/A  . Number of children: 3  . Years of education: college   Occupational History  . Not on file.  Social History Main Topics  . Smoking status: Former Smoker    Quit date: 09/06/1971  . Smokeless tobacco: Never Used     Comment: 1970  . Alcohol use 0.0 oz/week     Comment: 3-4 glasses of wine/ wk  . Drug use: No  . Sexual activity: Not on file   Other Topics Concern  . Not on file   Social History Narrative   She lives at home alone.  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  She has 3 children ,son's 2 and 8 and a daughter 64.  She quit tobacco in 1970.  She consumes 3-4 glasses of red wine per week.  She denies illicit drug use.  She consumes 2  mugs of caffeine in the AM.      Family History  Problem Relation Age of Onset  . Colon cancer Mother   . Cancer Sister   . Cancer Brother   . Congestive Heart Failure Mother   . Heart Problems Maternal Grandfather   . Heart Problems Maternal Grandmother   . Cancer Unknown        paternal siblings  . Cancer Brother     Past Medical History:  Diagnosis Date  . Eczema   . Glaucoma   . Hypothyroidism   . Left knee pain   . Lower back pain   . Memory loss 04/04/2013  . Migraine     Past Surgical History:  Procedure Laterality Date  . APPENDECTOMY     childhood  . arthroscopic surgery knee  1991  . CATARACT EXTRACTION   07/2013  . DILATION AND CURETTAGE OF UTERUS  1990  . GLAUCOMA SURGERY  07/2013  . TUBAL LIGATION  1977   BP 108/62   Pulse (!) 50   Ht 5\' 6"  (1.676 m)   Wt 147 lb (66.7 kg)   BMI 23.73 kg/m  Last Weight:  Wt Readings from Last 1 Encounters:  04/24/17 147 lb (66.7 kg)   Last Height:   Ht Readings from Last 1 Encounters:  04/24/17 5\' 6"  (1.676 m)    General: The patient is awake, alert and appears not in acute distress. The patient is well groomed. Head: Normocephalic, atraumatic. Neck is supple. Mallampati 2, neck [id number]:56.3 , no goiter,  Click at right TMJ . Cardiovascular: Regular rate and rhythm, without murmurs or carotid bruit, and without distended neck veins. Respiratory: Lungs are clear to auscultation. Skin:  Without evidence of edema, or rash. Trunk: BMI is normal.  Neurologic exam :  She is pleasant and cooperative , fully oriented and well informed of daily news. Speech is not longer fluent  Mood and affect are appropriate, she is a little anxious. . Cranial nerves: She reports no loss of smell or taste , pupils are equal and briskly reactive to light.  Extraocular movements in vertical and horizontal planes intact and without nystagmus. She is a little anxious,  Facial motor strength is symmetric and tongue and uvula move midline. Equal shoulder movement . Motor exam:  Normal tone and  muscle bulk and symmetric strength in all extremities. Good bilateral grip and pinch strength.   Finger-to-nose maneuver normal without evidence of ataxia, dysmetria or tremor. Gait and station: Patient walks with a walker  Today, 3 days after the fall and fracture.  Deep tendon reflexes: in the  upper and lower extremities are symmetric and intact.   Assessment:  After physical and neurologic examination, review of laboratory studies, imaging, neurophysiology  testing and pre-existing records, assessment :  Frustration, not agitation. No anger or impulse control issues.  Aricept to continue.   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  MOCA 17-30- next visit with MMSE/   Sincerely,   [name], MD

## 2017-05-03 ENCOUNTER — Ambulatory Visit: Payer: Medicare Other | Admitting: Neurology

## 2017-05-16 ENCOUNTER — Other Ambulatory Visit: Payer: Self-pay | Admitting: Neurology

## 2017-05-16 MED ORDER — MEMANTINE HCL 10 MG PO TABS: 10.0000 mg | ORAL_TABLET | Freq: Two times a day (BID) | ORAL | 11 refills | Status: DC

## 2017-08-22 DIAGNOSIS — D472 Monoclonal gammopathy: Secondary | ICD-10-CM | POA: Diagnosis not present

## 2017-11-06 ENCOUNTER — Other Ambulatory Visit: Payer: Self-pay | Admitting: Neurology

## 2018-02-15 ENCOUNTER — Ambulatory Visit: Payer: Medicare Other | Admitting: Neurology

## 2018-02-19 ENCOUNTER — Ambulatory Visit (INDEPENDENT_AMBULATORY_CARE_PROVIDER_SITE_OTHER): Payer: Medicare Other | Admitting: Neurology

## 2018-02-19 ENCOUNTER — Telehealth: Payer: Self-pay | Admitting: Neurology

## 2018-02-19 ENCOUNTER — Encounter: Payer: Self-pay | Admitting: Neurology

## 2018-02-19 VITALS — BP 111/60 | HR 58 | Ht 66.0 in | Wt 162.5 lb

## 2018-02-19 DIAGNOSIS — G309 Alzheimer's disease, unspecified: Secondary | ICD-10-CM

## 2018-02-19 DIAGNOSIS — G308 Other Alzheimer's disease: Secondary | ICD-10-CM | POA: Diagnosis not present

## 2018-02-19 DIAGNOSIS — F0280 Dementia in other diseases classified elsewhere without behavioral disturbance: Secondary | ICD-10-CM | POA: Diagnosis not present

## 2018-02-19 NOTE — Patient Instructions (Addendum)
Amyloid Beta Protein Precursor Test  Why am I having this test? This test may be used with patients who have increased dementia and confusion. It can be used to diagnose Alzheimer disease (AD) and different types of dementia. If symptoms of dementia are present in you or in someone you care for, your health care provider will do a thorough examination to try to determine the cause. This may include a variety of cognitive tests to assess memory. Scanning tests of the brain may also be done to look for abnormalities. Symptoms of dementia may include:  Memory loss.  Behavioral changes.  Decreased ability to perform daily life functions.  What kind of sample is taken? Cerebrospinal fluid (CSF) samples are collected using a lumbar puncture procedure, which is also called a spinal tap procedure. This procedure is usually done in a hospital or clinic. How do I prepare for this test? There is no preparation required for this test. What are the reference ranges? Reference ranges are considered healthy ranges established after testing a large group of healthy people. Reference ranges may vary among different people, labs, and hospitals. It is your responsibility to obtain your test results. Ask the lab or department performing the test when and how you will get your results. The reference range for beta amyloid proteins is greater than 450 units/L. What do the results mean? Decreased levels of beta amyloid proteins may indicate:  Alzheimer disease.  Other forms of dementia.  Talk with your health care provider to discuss your results, treatment options, and if necessary, the need for more tests. Talk with your health care provider if you have any questions about your results. Talk with your health care provider to discuss your results, treatment options, and if necessary, the need for more tests. Talk with your health care provider if you have any questions about your results. This information is  not intended to replace advice given to you by your health care provider. Make sure you discuss any questions you have with your health care provider. Document Released: 09/24/2004 Document Revised: 04/25/2016 Document Reviewed: 02/12/2014 Elsevier Interactive Patient Education  2018 [employer] Disease Caregiver Guide A person who has Alzheimer disease may not be able to take care of himself or herself. He or she may need help with simple tasks. The tips below can help you care for the person. Memory loss and confusion If the person is confused or cannot remember things:  Stay calm.  Respond with a short answer.  Avoid correcting him or her in a way that sounds like scolding.  Try not to take it personally, even if he or she forgets your name.  Behavior changes The person may go through behavior changes. This can include depression, anxiety, anger, or seeing things that are not there. When behavior changes:  Try not to take behavior changes personally.  Stay calm and patient.  Do not argue or try to convince the person about a specific point.  Know that these changes are part of the disease process. Try to work through it.  Tips to lessen frustration  Make appointments and do daily tasks when the person is at his or her best.  Take your time. Simple tasks may take longer. Allow plenty of time to complete tasks.  Limit choices for the person.  Involve the person in what you are doing.  Stick to a routine.  Avoid new or crowded places, if possible.  Use simple words, short sentences, and a calm voice.  Only give 1 direction at a time.  Buy clothes and shoes that are easy to put on and take off.  Let people help if they offer. Home safety  Keep floors clear. Remove rugs, magazine racks, and floor lamps.  Keep hallways well lit.  Put a handrail and nonslip mat in the bathtub or shower.  Put childproof locks on cabinets that have dangerous items in them.  These items include medicine, alcohol, guns, toxic cleaning items, sharp tools, matches, or lighters.  Place locks on doors where the person cannot see or reach them. This helps the person to not wander out of the house and get lost.  Be prepared for emergencies. Keep a list of emergency phone numbers and addresses in a handy area. Plans for the future  Talk about finances. ? Talk about money management. People with Alzheimer disease have trouble managing their money as the disease gets worse. ? Get help from professional advisors about financial and legal matters.  Talk about future care. ? Choose a power of attorney. This is someone who can make decisions for the person with Alzheimer disease when he or she can no longer do so. ? Talk about driving and when it is the right time to stop. The person's doctor can help with this. ? If the person lives alone, make sure he or she is safe. Some people need extra help at home. Other people need more care at a nursing home or care center. Support groups Some benefits of joining a support group include:  Learning ways to manage stress.  Sharing experiences with others.  Getting emotional comfort and support.  Learning new caregiving skills as the disease progresses.  Knowing what community resources are available and taking advantage of them.  Get help if:  The person has a fever.  The person has a sudden behavior change that does not get better with calming strategies.  The person is unable to manage his or her living situation.  The person threatens you or anyone else, including himself or herself.  You are no longer able to care for the person. This information is not intended to replace advice given to you by your health care provider. Make sure you discuss any questions you have with your health care provider. Document Released: 11/14/2011 Document Revised: 01/28/2016 Document Reviewed: 10/12/2011 Elsevier Interactive Patient  Education  2017 [employer].

## 2018-02-19 NOTE — Progress Notes (Signed)
Subjective:    [name]  Provider:  [name], M D  Referring Provider: [name], MD Primary Care Physician:  [name], MD  Chief Complaint  Patient presents with  . Follow-up    Rm 11. Patient here with daughter.  . Memory Loss    MMSE 13/30    HPI:  Leslie Roach is a 77 y.o. female  Is seen here as a revisit ( PCP is [name] )  for memory deficits, progressive over the last 5.5  years,  02-19-2018, patient has moved within [place], to [employer]. She lives in her own suite- 2 bedrooms, living area with a tea kitchen. Started on osteopenia medication, Alendronate. Last visit was in a boot from a fall related injury.  Stays on Namenda , and Aricept.  Has progressed with wordfinding difficulties. Dr Lin Landsman( daughter in law ) fills the medication box.  She has a helper for bathing 2 times a week, breakfast by herself, goes to the bank with her helper. No longer having control of her checking.  Not longer driving.  We can follow once a year for prescriptions.    MMSE - Mini Mental State Exam 02/19/2018 04/27/2015  Orientation to time 1 5  Orientation to Place 1 5  Registration 3 3  Attention/ Calculation 2 5  Recall 0 2  Language- name 2 objects 1 2  Language- repeat 0 1  Language- follow 3 step command 2 3  Language- read & follow direction 1 1  Write a sentence 1 1  Copy design 1 1  Total score 13 29      Interval history from 04/24/2017, I have the pleasure of meeting Leslie Roach today in the presence of her daughter, the family has noted progressive short-term memory loss and aphagia, difficulties with word finding. For the first time today we have a Montral cognitive assessment was less than 20 points and the would follow from now on with a Mini-Mental Status Examination, which is considered a more basic test. I noted very fragmented sentences. She fell , she reported :" I turned too quick in my home ",  she broke her right foot.  She now cannot longer drive, based on MOCA and clinical observation.    I have the pleasure of seeing Leslie Roach and [name] here today on 10/31/2016 in a revisit.  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 We amnestic pattern of her impairments continues to provide concerned for the presence of possible Alzheimer's disease, the patient has a bachelor's degree in psychology, she also talked for part of her adult life. The overall performance across cognitive domains was variable with several areas of note. Complex attention, multitasking and set shifting did not provide difficulties that that did not in a previous neuropsychological evaluation. She performs in the average range on simple attention, visual motor sequencing, confrontation naming, semantic fluency is preserved and she is very good at visual /construction. Her performance is above average of let to fluency inlet of fluency and superior and working memory.  She has been getting lost !  She gave up driving except on Sundays for church , her home is only 500 feet form her sons, church is 1/5 miles. The psychological testing battery confirms her day to day difficulties as she described.   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 MMSE  22/30.   Montreal Cognitive Assessment  04/24/2017 10/31/2016 04/27/2016 02/24/2016 10/28/2015  Visuospatial/ Executive (0/5) 4 5 4 4 5   Naming (0/3) 3 3 3 3 3   Attention: Read list of digits (0/2) 1 2 2 2 2   Attention: Read list of letters (0/1) 0 1 1 1 1   Attention: Serial 7 subtraction starting at 100 (0/3) 2 2 2 1 3   Language: Repeat phrase (0/2) 0 1 1 1 1   Language : Fluency (0/1) 1 1 1 1 1   Abstraction (0/2) 2 2 2 1 1   Delayed Recall (0/5) 0 0 1 0 0  Orientation (0/6) 4 5 6 6 6   Total 17 22 23 20 23   Adjusted  Score (based on education) - 22 23 20 23       10-2015 Leslie Roach is a very pleasant 77 year old right handed female patient , who presents for followup of her memory complaint of over 5 years duration .  She is accompanied today by her son, Dr Lovette Cliche. . She previously followed with [name] was last seen by him on 11/13/2012, which time he felt that she was stable she had an MMSE of 30 out of 30 at the time, clock drawing before and animal fluency of 10. She got lost on her way during to [facility] in September 2016 , and now has locked herself out of her house twice. Her sons cell phone number is stored on her hers, and she could call for help.   Leslie Roach underwent a PET scan or so-called metabolic PET area skin on 04/21/2016, it documented mild frontal atrophy no cortical infarction, no mass lesion and minimal decreased metabolism in the frontal lobe areas nor decreased cortical metabolism in the parietal lobes.  This scan does not identify Alzheimer's Type dementia but points towards a frontal cortical dementia .There was normal occipital lobe activity.    HIP:  10-2015  MOCA excellent still 23-30, slight decline. Her son and daughter in law feel she is worse. She feels more fatigued. Has had an abnormal protein-electrophoresis , M spike was noted, referral to oncologist is done,appointment is pending. She has good social contacts and a full social life, remains active . The patient lost her husband about 7 years ago and it was in the time after that her sons noticed delay naming and word recall. The patient had no history of any trauma,  prolonged surgery or anesthetic use. She is a very moderate drinker  @3   glasses of wine per week (at the maximum) ,  Her mother died at age 50 , she has a sister that at 79 had no specific memory problems but the sister is obese and has limited mobility. Leslie Roach now at 77 years of age had scored higher on her memory testing in 2015 than she  did in 2013, 2012!  [name] prescribed this medication in 2010 at 10 mg . Her cardiologist was concerned about bradycardia and discussed a reduction to 5 mg daily, we decided today on 5 mg bid.  She continues to eat well, keep active but has restricted her driving to residential and in daytime.  She has gotten lost on a way to [place], on [facility] [address] , took the wrong exit when she wanted to visit her daughter. Mrs. Digiacomo new MOCA:  Examination reveals early sign of dementia, . She scored  20-30,  She has been evaluated by a MOCA before, her score was 23-30. She is forgetful,  but able to balance her checkbook, she feels she overlooks things, has trouble to remember the shopping list, has been insecure in driving.She only does restricted driving now, she has good and bad days, something her son has confirmed.  No Parkinsonism, no hallucinations. Uses a hearing aid now- and complies with medication intake. Bradycardia has not let to fainting, can be attributed to Aricept. 5 mg bid usually gentler on her hear rate.   Review of Systems: Out of a complete 14 system review, the patient complains of only the following symptoms, and all other reviewed systems are negative.  Her word fluency is impaired, in social conversations she often feels frustrated. She reports memory loss, short term.  history of skin sensitivity and allergies, occasional rashes and some hearing loss. She reports no fainting , no lightheadedness and no near falls.   Social History   Socioeconomic History  . Marital status: Widowed    Spouse name: Not on file  . Number of children: 3  . Years of education: college  . Highest education level: Not on file  Occupational History  . Not on file  Social Needs  . Financial resource strain: Not on file  . Food insecurity:    Worry: Not on file    Inability: Not on file  . Transportation needs:    Medical: Not on file    Non-medical: Not on file  Tobacco Use  . Smoking status: Former  Smoker    Last attempt to quit: 09/06/1971    Years since quitting: 46.4  . Smokeless tobacco: Never Used  . Tobacco comment: 1970  Substance and Sexual Activity  . Alcohol use: Yes    Alcohol/week: 0.0 oz    Comment: 3-4 glasses of wine/ wk  . Drug use: No  . Sexual activity: Not on file  Lifestyle  . Physical activity:    Days per week: Not on file    Minutes per session: Not on file  . Stress: Not on file  Relationships  . Social connections:    Talks on phone: Not on file    Gets together: Not on file    Attends religious service: Not on file    Active member of club or organization: Not on file    Attends meetings of clubs or organizations: Not on file    Relationship status: Not on file  . Intimate partner violence:    Fear of current or ex partner: Not on file    Emotionally abused: Not on file    Physically abused: Not on file    Forced sexual activity: Not on file  Other Topics Concern  . Not on file  Social History Narrative   She lives at home alone.  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  She has 3 children ,son's 95 and 20 and a daughter 63.  She quit tobacco in 1970.  She consumes 3-4 glasses of red wine per week.  She denies illicit drug use.  She consumes 2  mugs of caffeine in the AM.      Family History  Problem Relation Age of Onset  . Colon cancer Mother   . Cancer Sister   . Cancer Brother   . Congestive Heart Failure Mother   . Heart Problems Maternal Grandfather   . Heart Problems Maternal Grandmother   . Cancer Unknown        paternal siblings  . Cancer Brother     Past Medical History:  Diagnosis Date  . Eczema   . Glaucoma   . Hypothyroidism   . Left knee pain   . Lower back pain   . Memory loss 04/04/2013  . Migraine     Past Surgical History:  Procedure Laterality Date  . APPENDECTOMY     childhood  . arthroscopic surgery knee  1991  . CATARACT EXTRACTION  07/2013  . DILATION AND CURETTAGE OF UTERUS  1990  .  GLAUCOMA SURGERY  07/2013  . TUBAL LIGATION  1977   BP 111/60   Pulse (!) 58   Ht 5\' 6"  (1.676 m)   Wt 162 lb 8 oz (73.7 kg)   BMI 26.23 kg/m  Last Weight:  Wt Readings from Last 1 Encounters:  02/19/18 162 lb 8 oz (73.7 kg)   Last Height:   Ht Readings from Last 1 Encounters:  02/19/18 5\' 6"  (1.676 m)    General: The patient is awake, alert and appears not in acute distress. The patient is well groomed. Head: Normocephalic, atraumatic. Neck is supple. Mallampati 2, neck [id number]:79.8 , no goiter,  Click at right TMJ . Cardiovascular: Regular rate and rhythm, without murmurs or carotid bruit, and without distended neck veins. Respiratory: Lungs are clear to auscultation. Skin:  Without evidence of edema, or rash. Trunk: BMI is 26. Neurologic exam : She is pleasant and cooperative , fully oriented and well informed of daily news. Speech is not longer fluent- a[phasia, word finding.   Mood and affect are appropriate, she is a little anxious.  Cranial nerves: She reports no loss of smell or taste , pupils are equal and briskly reactive to light.  Extraocular movements in vertical and horizontal planes intact and without nystagmus. She is a little anxious, and frustrated. She has some test anxiety.  Facial motor strength is symmetric and tongue and uvula move midline. Equal shoulder movement. Motor exam:  symmetric strength in all extremities, preserved  bilateral grip and pinch strength.  Finger-to-nose maneuver normal without evidence of ataxia, dysmetria or tremor.Gait and station: Patient walks unassisted again, fracture has healed.  Deep tendon reflexes: in the upper and lower extremities are symmetric.   Assessment:  After physical and neurologic examination, review of laboratory studies, imaging, neurophysiology testing and pre-existing records, assessment :  Frustration, not agitation. No anger or impulse control issues. Aricept to continue.    Alzheimer's is now the  working diagnosis- she may follow yearly or have PCP fill her medications. Marland Kitchen   MOCA 17-30 last visit, MMSE today  (02-19-2018) 13-30 points.    [name], MD  02-19-2018

## 2018-02-19 NOTE — Telephone Encounter (Signed)
Patient states she will call us to schedule 1 yr f/u w/ MD if needed.

## 2018-02-20 DIAGNOSIS — D472 Monoclonal gammopathy: Secondary | ICD-10-CM

## 2018-05-10 ENCOUNTER — Other Ambulatory Visit: Payer: Self-pay | Admitting: Neurology

## 2018-05-10 MED ORDER — MEMANTINE HCL 10 MG PO TABS: 10.0000 mg | ORAL_TABLET | Freq: Two times a day (BID) | ORAL | 9 refills | Status: DC

## 2018-08-06 ENCOUNTER — Other Ambulatory Visit: Payer: Self-pay | Admitting: Neurology

## 2018-10-09 DIAGNOSIS — R4182 Altered mental status, unspecified: Secondary | ICD-10-CM

## 2018-10-09 DIAGNOSIS — G459 Transient cerebral ischemic attack, unspecified: Secondary | ICD-10-CM

## 2018-10-09 DIAGNOSIS — R471 Dysarthria and anarthria: Secondary | ICD-10-CM

## 2018-10-09 DIAGNOSIS — G309 Alzheimer's disease, unspecified: Secondary | ICD-10-CM | POA: Diagnosis not present

## 2018-10-09 DIAGNOSIS — E039 Hypothyroidism, unspecified: Secondary | ICD-10-CM | POA: Diagnosis not present

## 2018-10-10 DIAGNOSIS — G459 Transient cerebral ischemic attack, unspecified: Secondary | ICD-10-CM | POA: Diagnosis not present

## 2018-10-10 DIAGNOSIS — E039 Hypothyroidism, unspecified: Secondary | ICD-10-CM | POA: Diagnosis not present

## 2018-10-10 DIAGNOSIS — G309 Alzheimer's disease, unspecified: Secondary | ICD-10-CM | POA: Diagnosis not present

## 2018-10-12 ENCOUNTER — Other Ambulatory Visit: Payer: Self-pay | Admitting: Emergency Medicine

## 2018-10-12 MED ORDER — PRAVASTATIN SODIUM 20 MG PO TABS: 20.0000 mg | ORAL_TABLET | Freq: Every evening | ORAL | 3 refills | Status: AC

## 2018-10-12 NOTE — Telephone Encounter (Signed)
Per Dr. Agustin Cree ordered pravastatin 20 mg daily for patient.

## 2019-02-21 DIAGNOSIS — D472 Monoclonal gammopathy: Secondary | ICD-10-CM

## 2019-02-28 ENCOUNTER — Other Ambulatory Visit: Payer: Self-pay | Admitting: Neurology

## 2019-02-28 ENCOUNTER — Telehealth: Payer: Self-pay | Admitting: Neurology

## 2019-02-28 NOTE — Telephone Encounter (Signed)
Called the patient. There was no answer. LVM informing that we received a request for her medication to be refilled. Advised that we would need to make a yearly apt. LVM for them to call back to schedule apt. Can be with NP just for medication management yearly apt

## 2019-08-07 ENCOUNTER — Other Ambulatory Visit: Payer: Self-pay

## 2019-08-07 ENCOUNTER — Inpatient Hospital Stay (HOSPITAL_COMMUNITY)
Admission: EM | Admit: 2019-08-07 | Discharge: 2019-08-13 | DRG: 177 | Disposition: A | Discharge status: Nursing Home (Includes ICF) | Payer: Medicare Other | Attending: Internal Medicine | Admitting: Internal Medicine

## 2019-08-07 ENCOUNTER — Emergency Department (HOSPITAL_COMMUNITY): Payer: Medicare Other

## 2019-08-07 DIAGNOSIS — U071 COVID-19: Secondary | ICD-10-CM | POA: Diagnosis not present

## 2019-08-07 DIAGNOSIS — Z7982 Long term (current) use of aspirin: Secondary | ICD-10-CM

## 2019-08-07 DIAGNOSIS — J1289 Other viral pneumonia: Secondary | ICD-10-CM | POA: Diagnosis present

## 2019-08-07 DIAGNOSIS — Z515 Encounter for palliative care: Secondary | ICD-10-CM | POA: Diagnosis present

## 2019-08-07 DIAGNOSIS — R0902 Hypoxemia: Secondary | ICD-10-CM

## 2019-08-07 DIAGNOSIS — Z7983 Long term (current) use of bisphosphonates: Secondary | ICD-10-CM

## 2019-08-07 DIAGNOSIS — F0280 Dementia in other diseases classified elsewhere without behavioral disturbance: Secondary | ICD-10-CM | POA: Diagnosis present

## 2019-08-07 DIAGNOSIS — Z79890 Hormone replacement therapy (postmenopausal): Secondary | ICD-10-CM

## 2019-08-07 DIAGNOSIS — Z79899 Other long term (current) drug therapy: Secondary | ICD-10-CM

## 2019-08-07 DIAGNOSIS — J1282 Pneumonia due to coronavirus disease 2019: Secondary | ICD-10-CM | POA: Diagnosis present

## 2019-08-07 DIAGNOSIS — R413 Other amnesia: Secondary | ICD-10-CM | POA: Diagnosis present

## 2019-08-07 DIAGNOSIS — Z800 Family history of malignant neoplasm of digestive organs: Secondary | ICD-10-CM

## 2019-08-07 DIAGNOSIS — A419 Sepsis, unspecified organism: Secondary | ICD-10-CM

## 2019-08-07 DIAGNOSIS — G309 Alzheimer's disease, unspecified: Secondary | ICD-10-CM | POA: Diagnosis present

## 2019-08-07 DIAGNOSIS — Z8249 Family history of ischemic heart disease and other diseases of the circulatory system: Secondary | ICD-10-CM

## 2019-08-07 DIAGNOSIS — E876 Hypokalemia: Secondary | ICD-10-CM | POA: Diagnosis present

## 2019-08-07 DIAGNOSIS — H409 Unspecified glaucoma: Secondary | ICD-10-CM | POA: Diagnosis present

## 2019-08-07 DIAGNOSIS — J9601 Acute respiratory failure with hypoxia: Secondary | ICD-10-CM | POA: Diagnosis present

## 2019-08-07 DIAGNOSIS — Z66 Do not resuscitate: Secondary | ICD-10-CM | POA: Diagnosis present

## 2019-08-07 DIAGNOSIS — E039 Hypothyroidism, unspecified: Secondary | ICD-10-CM | POA: Diagnosis present

## 2019-08-07 DIAGNOSIS — E785 Hyperlipidemia, unspecified: Secondary | ICD-10-CM | POA: Diagnosis present

## 2019-08-07 DIAGNOSIS — Z87891 Personal history of nicotine dependence: Secondary | ICD-10-CM

## 2019-08-07 NOTE — ED Provider Notes (Signed)
[facility] DEPT Provider Note   CSN: [id number] Arrival date & time: 08/07/19  2235     History   Chief Complaint Chief Complaint  Patient presents with  . Shortness of Breath  . Fever    HPI Leslie Roach is a 78 y.o. female.     The history is provided by the patient and medical records.     LEVEL V CAVEAT:  DEMENTIA  78 y.o. F with hx of eczema, glaucoma, memory loss, migraine headaches, alzheimer's disease, presenting to the ED for suspected COVID.  States today she seemed a little off today, not really answering questions which she is normally able to do to some degree.  States at her facility, [facility], several people on her hallway have tested positive for COVID.  Patient's O2 sats today were around 90%, generally does not require home O2.  Unsure regarding cough.  She did have noted fever today, no meds given PTA.  Past Medical History:  Diagnosis Date  . Eczema   . Glaucoma   . Hypothyroidism   . Left knee pain   . Lower back pain   . Memory loss 04/04/2013  . Migraine     Patient Active Problem List   Diagnosis Date Noted  . Alzheimer disease ([place]) 02/19/2018  . 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 Memory loss 04/04/2013    Past Surgical History:  Procedure Laterality Date  . APPENDECTOMY     childhood  . arthroscopic surgery knee  1991  . CATARACT EXTRACTION  07/2013  . DILATION AND CURETTAGE OF UTERUS  1990  . GLAUCOMA SURGERY  07/2013  . TUBAL LIGATION  1977     OB History   No obstetric history on file.      Home Medications    Prior to Admission medications   Medication Sig Start Date End Date Taking? Authorizing Provider  alendronate (FOSAMAX) 10 MG tablet Take by mouth. 11/08/17   [provider]  aspirin 81 MG tablet Take 81 mg by mouth daily.    [provider]  AZOPT 1 % ophthalmic suspension instill 1 drop in both eyes 2 times every day  09/14/16   [provider]  bimatoprost (LUMIGAN) 0.01 % SOLN 1 drop at bedtime. One drop in each eye once a day    [provider]  brimonidine (ALPHAGAN) 0.2 % ophthalmic solution instill 1 drop in both eye every 12 hours 09/26/16   [provider]  cholecalciferol (VITAMIN D) 1000 UNITS tablet Take 1,000 Units by mouth daily.    [provider]  donepezil (ARICEPT) 10 MG tablet TAKE 1/2 TABLET BY MOUTH EVERY DAY and TAKE 1/2 TABLET BY MOUTH EVERY EVENING 08/06/18   Dohmeier, Asencion Partridge, MD  levothyroxine (SYNTHROID, LEVOTHROID) 25 MCG tablet Take 25 mcg by mouth daily before breakfast.    [provider]  memantine (NAMENDA) 10 MG tablet TAKE ONE TABLET BY MOUTH TWICE DAILY 02/28/19   Dohmeier, Asencion Partridge, MD  pravastatin (PRAVACHOL) 20 MG tablet Take 1 tablet (20 mg total) by mouth every evening. 10/12/18 01/10/19  Park Liter, MD    Family History Family History  Problem Relation Age of Onset  . Colon cancer Mother   . Cancer Sister   . Cancer Brother   . Congestive Heart Failure Mother   . Heart Problems Maternal Grandfather   . Heart Problems Maternal Grandmother   . Cancer Unknown  paternal siblings  . Cancer Brother     Social History Social History   Tobacco Use  . Smoking status: Former Smoker    Quit date: 09/06/1971    Years since quitting: 47.9  . Smokeless tobacco: Never Used  . Tobacco comment: 1970  Substance Use Topics  . Alcohol use: Yes    Alcohol/week: 0.0 standard drinks    Comment: 3-4 glasses of wine/ wk  . Drug use: No     Allergies   Patient has no known allergies.   Review of Systems Review of Systems  Unable to perform ROS: Mental status change     Physical Exam Updated Vital Signs Ht 5\' 4"  (1.626 m)   Wt 73 kg   BMI 27.62 kg/m   Physical Exam Vitals signs and nursing note reviewed.  Constitutional:      Appearance: She is well-developed.  HENT:     Head: Normocephalic and atraumatic.   Eyes:     Conjunctiva/sclera: Conjunctivae normal.     Pupils: Pupils are equal, round, and reactive to light.  Neck:     Musculoskeletal: Normal range of motion.  Cardiovascular:     Rate and Rhythm: Normal rate and regular rhythm.     Heart sounds: Normal heart sounds.  Pulmonary:     Effort: Pulmonary effort is normal.     Breath sounds: Normal breath sounds. No wheezing or rhonchi.     Comments: 3L supplemental O2 in use, slight tachypnea but no retractions or accessory muscle use Abdominal:     General: Bowel sounds are normal.     Palpations: Abdomen is soft.  Musculoskeletal: Normal range of motion.  Skin:    General: Skin is warm and dry.  Neurological:     Comments: Pleasantly confused, moving arms and legs well      ED Treatments / Results  Labs (all labs ordered are listed, but only abnormal results are displayed) Labs Reviewed  COMPREHENSIVE METABOLIC PANEL - Abnormal; Notable for the following components:      Result Value   Potassium 3.4 (*)    Glucose, Bld 122 (*)    All other components within normal limits  CBC WITH DIFFERENTIAL/PLATELET - Abnormal; Notable for the following components:   Lymphs Abs 0.5 (*)    All other components within normal limits  POC SARS CORONAVIRUS 2 AG -  ED - Abnormal; Notable for the following components:   SARS Coronavirus 2 Ag POSITIVE (*)    All other components within normal limits  CULTURE, BLOOD (ROUTINE X 2)  CULTURE, BLOOD (ROUTINE X 2)  URINE CULTURE  LACTIC ACID, PLASMA  PROTIME-INR  LACTIC ACID, PLASMA  URINALYSIS, ROUTINE W REFLEX MICROSCOPIC  BRAIN NATRIURETIC PEPTIDE  C-REACTIVE PROTEIN  D-DIMER, QUANTITATIVE (NOT AT [facility])  FERRITIN  LACTATE DEHYDROGENASE  PROCALCITONIN    EKG EKG Interpretation  Date/Time:  Wednesday August 07 2019 23:47:16 EST Ventricular Rate:  71 PR Interval:    QRS Duration: 99 QT Interval:  402 QTC Calculation: 437 R Axis:   -35 Text Interpretation: Sinus rhythm Atrial  premature complex Left axis deviation Confirmed by Randal Buba, April ([id number]) on 08/08/2019 2:03:55 AM   Radiology Dg Chest Port 1 View  Result Date: 08/07/2019 CLINICAL DATA:  Suspected sepsis EXAM: PORTABLE CHEST 1 VIEW COMPARISON:  10/09/2018 FINDINGS: Bibasilar atelectasis. Low lung volumes. Heart is normal size. No effusions or pneumothorax. No acute bony abnormality. IMPRESSION: Low volumes with bibasilar atelectasis. Electronically Signed   By: Rolm Baptise M.D.  On: 08/07/2019 23:11    Procedures Procedures (including critical care time)  Medications Ordered in ED Medications  levothyroxine (SYNTHROID) tablet 25 mcg (has no administration in time range)  donepezil (ARICEPT) tablet 10 mg (has no administration in time range)  pravastatin (PRAVACHOL) tablet 20 mg (has no administration in time range)  memantine (NAMENDA) tablet 10 mg (has no administration in time range)  aspirin tablet 325 mg (has no administration in time range)  citalopram (CELEXA) tablet 10 mg (has no administration in time range)  dorzolamide (TRUSOPT) 2 % ophthalmic solution 1 drop (has no administration in time range)  latanoprost (XALATAN) 0.005 % ophthalmic solution 1 drop (has no administration in time range)  acetaminophen (TYLENOL) tablet 1,000 mg (has no administration in time range)  enoxaparin (LOVENOX) injection 40 mg (has no administration in time range)  Ipratropium-Albuterol (COMBIVENT) respimat 1 puff (has no administration in time range)  dexamethasone (DECADRON) tablet 6 mg (has no administration in time range)  guaiFENesin-dextromethorphan (ROBITUSSIN DM) 100-10 MG/5ML syrup 10 mL (has no administration in time range)  chlorpheniramine-HYDROcodone (TUSSIONEX) 10-8 MG/5ML suspension 5 mL (has no administration in time range)  vitamin C (ASCORBIC ACID) tablet 500 mg (has no administration in time range)  zinc sulfate capsule 220 mg (has no administration in time range)  docusate sodium (COLACE)  capsule 100 mg (has no administration in time range)  remdesivir 200 mg in sodium chloride 0.9 % 250 mL IVPB (has no administration in time range)    Followed by  remdesivir 100 mg in sodium chloride 0.9 % 250 mL IVPB (has no administration in time range)     Initial Impression / Assessment and Plan / ED Course  I have reviewed the triage vital signs and the nursing notes.  Pertinent labs & imaging results that were available during my care of the patient were reviewed by me and considered in my medical decision making (see chart for details).  78 year old female presenting to the ED from nursing facility due to fever, shortness of breath, and new oxygen requirement.  She was hypoxic down to 88-90% with EMS, generally does not require home O2.  Facility reported she had been a little "off" today and had developed fever.  She has not given any medications prior to arrival.  Patient with low-grade fever here and is on 4 L supplemental oxygen but appears comfortable and vitals are overall stable.  She is pleasantly demented but not any significant distress.  From facility report, several other residents on her hallway have recently tested positive for Covid so this is certainly primary concern.  Will get basic labs, CXR, blood and urine cultures, rapid COVID  11:05 PM Spoke with patient's son, [name], shortly after arrival here-- patient is to be made DNR.  He was very insistent she was not receive any kind of intubation or cardiac resuscitation.  He along with family are comfortable with medications and comfort measures.  Patient's rapid COVID screen is positive.  Her labs are overall reassuring with normal lactate and no significant electrolyte imbalance.  Chest x-ray without any pulmonary infiltrates.  She was able to be tapered down from 4 L to 2 L and maintain oxygen saturation greater than 95%.  We will plan for admission for symptomatic care.  Discussed with hospitalist team--  patient will be admitted for ongoing care.  Final Clinical Impressions(s) / ED Diagnoses   Final diagnoses:  COVID-19 virus infection  Hypoxia    ED Discharge Orders  None       Larene Pickett, PA-C 08/08/19 [id number]    Milton Ferguson, MD 08/10/19 0800

## 2019-08-07 NOTE — ED Triage Notes (Addendum)
Pt presents to the ED via [facility] coming from [facility]. Pt is oriented to baseline per staff on scene, doesn't usually answer questions but is normally able to answer questions. Pts hallway at facility she is at has had several pts test positive for COVID in the last week. EMS reports pts O2 was 90 on RA, placed pt on 4 LPM. EMS states the spoke with the pts son who is POA and a physician and he states his mother is a DNR and they do not want her intubated or anything the facility did not have the paperwork to give to EMS though. Meds and comfort measures are "ok".   Pts son is [name] [phone]

## 2019-08-08 ENCOUNTER — Encounter (HOSPITAL_COMMUNITY): Payer: Self-pay

## 2019-08-08 DIAGNOSIS — Z800 Family history of malignant neoplasm of digestive organs: Secondary | ICD-10-CM | POA: Diagnosis not present

## 2019-08-08 DIAGNOSIS — Z66 Do not resuscitate: Secondary | ICD-10-CM | POA: Diagnosis present

## 2019-08-08 DIAGNOSIS — E876 Hypokalemia: Secondary | ICD-10-CM | POA: Diagnosis present

## 2019-08-08 DIAGNOSIS — E785 Hyperlipidemia, unspecified: Secondary | ICD-10-CM | POA: Diagnosis present

## 2019-08-08 DIAGNOSIS — R413 Other amnesia: Secondary | ICD-10-CM

## 2019-08-08 DIAGNOSIS — J1282 Pneumonia due to coronavirus disease 2019: Secondary | ICD-10-CM | POA: Diagnosis present

## 2019-08-08 DIAGNOSIS — G308 Other Alzheimer's disease: Secondary | ICD-10-CM | POA: Diagnosis not present

## 2019-08-08 DIAGNOSIS — R0902 Hypoxemia: Secondary | ICD-10-CM | POA: Diagnosis present

## 2019-08-08 DIAGNOSIS — Z7983 Long term (current) use of bisphosphonates: Secondary | ICD-10-CM | POA: Diagnosis not present

## 2019-08-08 DIAGNOSIS — Z8249 Family history of ischemic heart disease and other diseases of the circulatory system: Secondary | ICD-10-CM | POA: Diagnosis not present

## 2019-08-08 DIAGNOSIS — H409 Unspecified glaucoma: Secondary | ICD-10-CM | POA: Diagnosis present

## 2019-08-08 DIAGNOSIS — G309 Alzheimer's disease, unspecified: Secondary | ICD-10-CM | POA: Diagnosis present

## 2019-08-08 DIAGNOSIS — J9601 Acute respiratory failure with hypoxia: Secondary | ICD-10-CM

## 2019-08-08 DIAGNOSIS — Z515 Encounter for palliative care: Secondary | ICD-10-CM | POA: Diagnosis present

## 2019-08-08 DIAGNOSIS — Z79899 Other long term (current) drug therapy: Secondary | ICD-10-CM | POA: Diagnosis not present

## 2019-08-08 DIAGNOSIS — Z87891 Personal history of nicotine dependence: Secondary | ICD-10-CM | POA: Diagnosis not present

## 2019-08-08 DIAGNOSIS — F0280 Dementia in other diseases classified elsewhere without behavioral disturbance: Secondary | ICD-10-CM | POA: Diagnosis present

## 2019-08-08 DIAGNOSIS — E039 Hypothyroidism, unspecified: Secondary | ICD-10-CM | POA: Diagnosis present

## 2019-08-08 DIAGNOSIS — Z79890 Hormone replacement therapy (postmenopausal): Secondary | ICD-10-CM | POA: Diagnosis not present

## 2019-08-08 DIAGNOSIS — J1289 Other viral pneumonia: Secondary | ICD-10-CM | POA: Diagnosis present

## 2019-08-08 DIAGNOSIS — U071 COVID-19: Secondary | ICD-10-CM | POA: Diagnosis present

## 2019-08-08 DIAGNOSIS — Z7982 Long term (current) use of aspirin: Secondary | ICD-10-CM | POA: Diagnosis not present

## 2019-08-08 LAB — URINALYSIS, ROUTINE W REFLEX MICROSCOPIC
Bilirubin Urine: NEGATIVE
Glucose, UA: NEGATIVE mg/dL
Ketones, ur: NEGATIVE mg/dL
Nitrite: NEGATIVE
Protein, ur: NEGATIVE mg/dL
Specific Gravity, Urine: 1.011 (ref 1.005–1.030)
pH: 8 (ref 5.0–8.0)

## 2019-08-08 LAB — PROCALCITONIN: Procalcitonin: <0.1 ng/mL

## 2019-08-08 LAB — CBC WITH DIFFERENTIAL/PLATELET
Abs Immature Granulocytes: 0.03 10*3/uL (ref 0.00–0.07)
Basophils Absolute: 0 10*3/uL (ref 0.0–0.1)
Basophils Relative: 1 %
Eosinophils Absolute: 0 10*3/uL (ref 0.0–0.5)
Eosinophils Relative: 0 %
HCT: 42.4 % (ref 36.0–46.0)
Hemoglobin: 13.3 g/dL (ref 12.0–15.0)
Immature Granulocytes: 1 %
Lymphocytes Relative: 10 %
Lymphs Abs: 0.5 10*3/uL — ABNORMAL LOW (ref 0.7–4.0)
MCH: 28.2 pg (ref 26.0–34.0)
MCHC: 31.4 g/dL (ref 30.0–36.0)
MCV: 90 fL (ref 80.0–100.0)
Monocytes Absolute: 0.8 10*3/uL (ref 0.1–1.0)
Monocytes Relative: 18 %
Neutro Abs: 3.3 10*3/uL (ref 1.7–7.7)
Neutrophils Relative %: 70 %
Platelets: 300 10*3/uL (ref 150–400)
RBC: 4.71 MIL/uL (ref 3.87–5.11)
RDW: 14.1 % (ref 11.5–15.5)
WBC: 4.6 10*3/uL (ref 4.0–10.5)
nRBC: 0 % (ref 0.0–0.2)

## 2019-08-08 LAB — FERRITIN: Ferritin: 35 ng/mL (ref 11–307)

## 2019-08-08 LAB — PROTIME-INR
INR: 0.9 (ref 0.8–1.2)
Prothrombin Time: 12.5 seconds (ref 11.4–15.2)

## 2019-08-08 LAB — COMPREHENSIVE METABOLIC PANEL
ALT: 21 U/L (ref 0–44)
AST: 31 U/L (ref 15–41)
Albumin: 3.6 g/dL (ref 3.5–5.0)
Alkaline Phosphatase: 96 U/L (ref 38–126)
Anion gap: 11 (ref 5–15)
BUN: 14 mg/dL (ref 8–23)
CO2: 28 mmol/L (ref 22–32)
Calcium: 9.1 mg/dL (ref 8.9–10.3)
Chloride: 98 mmol/L (ref 98–111)
Creatinine, Ser: 0.64 mg/dL (ref 0.44–1.00)
GFR calc Af Amer: >60 mL/min (ref >60)
GFR calc non Af Amer: >60 mL/min (ref >60)
Glucose, Bld: 122 mg/dL — ABNORMAL HIGH (ref 70–99)
Potassium: 3.4 mmol/L — ABNORMAL LOW (ref 3.5–5.1)
Sodium: 137 mmol/L (ref 135–145)
Total Bilirubin: 0.3 mg/dL (ref 0.3–1.2)
Total Protein: 6.5 g/dL (ref 6.5–8.1)

## 2019-08-08 LAB — D-DIMER, QUANTITATIVE: D-Dimer, Quant: 1.08 ug/mL-FEU — ABNORMAL HIGH (ref 0.00–0.50)

## 2019-08-08 LAB — C-REACTIVE PROTEIN: CRP: 2.4 mg/dL — ABNORMAL HIGH (ref <1.0)

## 2019-08-08 LAB — POC SARS CORONAVIRUS 2 AG -  ED: SARS Coronavirus 2 Ag: POSITIVE — AB

## 2019-08-08 LAB — LACTATE DEHYDROGENASE: LDH: 143 U/L (ref 98–192)

## 2019-08-08 LAB — GLUCOSE, CAPILLARY: Glucose-Capillary: 111 mg/dL — ABNORMAL HIGH (ref 70–99)

## 2019-08-08 LAB — LACTIC ACID, PLASMA
Lactic Acid, Venous: 0.9 mmol/L (ref 0.5–1.9)
Lactic Acid, Venous: 1.1 mmol/L (ref 0.5–1.9)

## 2019-08-08 LAB — MRSA PCR SCREENING: MRSA by PCR: NEGATIVE

## 2019-08-08 LAB — BRAIN NATRIURETIC PEPTIDE: B Natriuretic Peptide: 54.8 pg/mL (ref 0.0–100.0)

## 2019-08-08 MED ORDER — ASPIRIN 325 MG PO TABS
325.0000 mg | ORAL_TABLET | Freq: Every day | ORAL | Status: DC
  Administered 2019-08-08 – 2019-08-13 (×6): 325 mg via ORAL
  Filled 2019-08-08 (×6): qty 1

## 2019-08-08 MED ORDER — GUAIFENESIN-DM 100-10 MG/5ML PO SYRP: 10.0000 mL | ORAL_SOLUTION | ORAL | Status: DC | PRN

## 2019-08-08 MED ORDER — LATANOPROST 0.005 % OP SOLN
1.0000 [drp] | Freq: Every day | OPHTHALMIC | Status: DC
  Administered 2019-08-08 – 2019-08-12 (×5): 1 [drp] via OPHTHALMIC
  Filled 2019-08-08: qty 2.5

## 2019-08-08 MED ORDER — DOCUSATE SODIUM 100 MG PO CAPS
100.0000 mg | ORAL_CAPSULE | Freq: Every day | ORAL | Status: DC
  Administered 2019-08-08 – 2019-08-13 (×5): 100 mg via ORAL
  Filled 2019-08-08 (×6): qty 1

## 2019-08-08 MED ORDER — ZINC SULFATE 220 (50 ZN) MG PO CAPS
220.0000 mg | ORAL_CAPSULE | Freq: Every day | ORAL | Status: DC
  Administered 2019-08-08 – 2019-08-13 (×6): 220 mg via ORAL
  Filled 2019-08-08 (×6): qty 1

## 2019-08-08 MED ORDER — HYDROCOD POLST-CPM POLST ER 10-8 MG/5ML PO SUER: 5.0000 mL | Freq: Two times a day (BID) | ORAL | Status: DC | PRN

## 2019-08-08 MED ORDER — SODIUM CHLORIDE 0.9 % IV SOLN
200.0000 mg | Freq: Once | INTRAVENOUS | Status: AC
  Administered 2019-08-08: 200 mg via INTRAVENOUS
  Filled 2019-08-08: qty 40

## 2019-08-08 MED ORDER — SODIUM CHLORIDE 0.9 % IV SOLN
100.0000 mg | INTRAVENOUS | Status: DC
  Administered 2019-08-09: 100 mg via INTRAVENOUS
  Filled 2019-08-08 (×2): qty 20

## 2019-08-08 MED ORDER — MEMANTINE HCL 10 MG PO TABS
10.0000 mg | ORAL_TABLET | Freq: Two times a day (BID) | ORAL | Status: DC
  Administered 2019-08-08 – 2019-08-13 (×11): 10 mg via ORAL
  Filled 2019-08-08 (×13): qty 1

## 2019-08-08 MED ORDER — ACETAMINOPHEN 500 MG PO TABS
1000.0000 mg | ORAL_TABLET | Freq: Four times a day (QID) | ORAL | Status: DC | PRN
  Administered 2019-08-10 – 2019-08-12 (×5): 1000 mg via ORAL
  Filled 2019-08-08 (×5): qty 2

## 2019-08-08 MED ORDER — DEXAMETHASONE 4 MG PO TABS
6.0000 mg | ORAL_TABLET | ORAL | Status: DC
  Administered 2019-08-08 – 2019-08-13 (×6): 6 mg via ORAL
  Filled 2019-08-08 (×2): qty 2
  Filled 2019-08-08: qty 1
  Filled 2019-08-08 (×3): qty 2

## 2019-08-08 MED ORDER — DONEPEZIL HCL 10 MG PO TABS
10.0000 mg | ORAL_TABLET | Freq: Every day | ORAL | Status: DC
  Administered 2019-08-08 – 2019-08-12 (×5): 10 mg via ORAL
  Filled 2019-08-08 (×5): qty 1

## 2019-08-08 MED ORDER — PRAVASTATIN SODIUM 20 MG PO TABS
20.0000 mg | ORAL_TABLET | Freq: Every evening | ORAL | Status: DC
  Administered 2019-08-08 – 2019-08-12 (×5): 20 mg via ORAL
  Filled 2019-08-08 (×5): qty 1

## 2019-08-08 MED ORDER — IPRATROPIUM-ALBUTEROL 20-100 MCG/ACT IN AERS
1.0000 | INHALATION_SPRAY | Freq: Four times a day (QID) | RESPIRATORY_TRACT | Status: DC
  Administered 2019-08-08 – 2019-08-12 (×15): 1 via RESPIRATORY_TRACT
  Filled 2019-08-08: qty 4

## 2019-08-08 MED ORDER — LEVOTHYROXINE SODIUM 25 MCG PO TABS
25.0000 ug | ORAL_TABLET | Freq: Every day | ORAL | Status: DC
  Administered 2019-08-09 – 2019-08-12 (×5): 25 ug via ORAL
  Filled 2019-08-08 (×5): qty 1

## 2019-08-08 MED ORDER — VITAMIN C 500 MG PO TABS
500.0000 mg | ORAL_TABLET | Freq: Every day | ORAL | Status: DC
  Administered 2019-08-08 – 2019-08-13 (×6): 500 mg via ORAL
  Filled 2019-08-08 (×6): qty 1

## 2019-08-08 MED ORDER — CITALOPRAM HYDROBROMIDE 20 MG PO TABS
10.0000 mg | ORAL_TABLET | Freq: Every day | ORAL | Status: DC
  Administered 2019-08-08 – 2019-08-13 (×6): 10 mg via ORAL
  Filled 2019-08-08 (×6): qty 1

## 2019-08-08 MED ORDER — ENOXAPARIN SODIUM 40 MG/0.4ML ~~LOC~~ SOLN
40.0000 mg | SUBCUTANEOUS | Status: DC
  Administered 2019-08-08 – 2019-08-13 (×6): 40 mg via SUBCUTANEOUS
  Filled 2019-08-08 (×6): qty 0.4

## 2019-08-08 MED ORDER — DORZOLAMIDE HCL 2 % OP SOLN
1.0000 [drp] | Freq: Two times a day (BID) | OPHTHALMIC | Status: DC
  Administered 2019-08-08 – 2019-08-13 (×11): 1 [drp] via OPHTHALMIC
  Filled 2019-08-08: qty 10

## 2019-08-08 NOTE — ED Notes (Signed)
[facility] notified for transport to [facility].

## 2019-08-08 NOTE — ED Notes (Signed)
Waiting for pharmacy to verify medications in order to give 0200 scheduled meds, unable to pull from pyxis until then.

## 2019-08-08 NOTE — ED Notes (Signed)
Spoke to son Lovette Cliche MD, would like update when Pt is transported to Floor.

## 2019-08-08 NOTE — Progress Notes (Signed)
Patient seen and examined personally, I reviewed the chart, history and physical and admission note, done by admitting physician this morning and agree with the same with following addendum.  Please refer to the morning admission note for more detailed plan of care.  Briefly,  78 year old female with history of dementia/memory loss, hypothyroidism, low back pain/left knee pain, glaucoma sent from skilled nursing facility due to progressive shortness of breath fever chills.  Patient is a long-term resident at [facility] due to [place] outbreak with several residents with testing positive for Covid.  EMS was called, O2 was 90% on room air was placed on 4 L and transported to the ER. In the ER, chest x-ray with bibasilar infiltrates, positive for COVID-19 saturations were in upper 80s on room air, initially tachypneic in 24/min, hr 74/min.  Labs showed CRP 2.4 procalcitonin less than 0.1 D-dimer 1.0, ferritin normal at 35. Patient was admitted and started on Decadron remdesivir supplemental oxygen  On exam When I saw her this morning she was resting comfortably on room air, breathing at 18 to 20/min, able to tell me her name- does not know where she is  and did not appear to be in any acute distress. had just eaten her meal.  She is alert awake and oriented x1 Lungs are clear diminished at the base S1/S2+ Able to move her extremities no focal weakness Abdomen soft nontender  Issues being addressed  COVID-19 pneumonia/acute pulmonary insufficiency:Chest x-ray "Low volumes with bibasilar atelectasis"Continue iv Decadron, remdesivir pharmacy to dose, supplemental oxygen, supportive care vitamin C.  DVT prophylaxis with Lovenox.  Continue airborne precaution as per protocol.  Dementia memory loss:continue supportive care home Celexa Aricept Namenda. Hypothyroidism continue Synthroid. HLD-continue statin  CODE STATUS:DNR.Prognosis remains to be seen. Family communication: updated patient's son  Dr. Lin Landsman who works at [employer], over the phone.  He told me that patient's memory care unit will be able to take her back once she is discharged. Disposition: Remains inpatient for Covid pneumonia treatment.

## 2019-08-08 NOTE — H&P (Signed)
TRH H&P   Patient Demographics:    Leslie Roach, is a 78 y.o. female  MRN: [id number]   DOB - March 03, 1941  Admit Date - 08/07/2019  Outpatient Primary MD for the patient is [name], MD  Patient coming from: SNF  Chief Complaint  Patient presents with  . Shortness of Breath  . Fever      HPI:    Leslie Roach  is a 78 y.o. female, with dementia who presents from SNF with progressive shortness of breath, fever, chills.  Patient with advanced dementia, unable to provide history, history obtained from reviewing the medical chart.  Apparently there is a SARS-CoV-2 outbreak at a facility, EMS was called due to progressive shortness of breath, fever, chills.  Patient brought to the ER, where she tested positive for SARS-CoV-2.  No further history is available at this time.    Review of systems:  Review of Systems: Unable to obtain due to patient's advanced dementia   With Past History of the following :   Past Medical History:  Diagnosis Date  . Eczema   . Glaucoma   . Hypothyroidism   . Left knee pain   . Lower back pain   . Memory loss 04/04/2013  . Migraine       Past Surgical History:  Procedure Laterality Date  . APPENDECTOMY     childhood  . arthroscopic surgery knee  1991  . CATARACT EXTRACTION  07/2013  . DILATION AND CURETTAGE OF UTERUS  1990  . GLAUCOMA SURGERY  07/2013  . TUBAL LIGATION  1977     Social History:     Social History   Tobacco Use  . Smoking status: Former Smoker    Quit date: 09/06/1971    Years since quitting: 47.9  . Smokeless tobacco: Never Used  . Tobacco comment: 1970  Substance Use Topics  . Alcohol use: Yes    Alcohol/week: 0.0 standard drinks   Comment: 3-4 glasses of wine/ wk     Family History :     Family History  Problem Relation Age of Onset  . Colon cancer Mother   . Cancer Sister   . Cancer Brother   . Congestive Heart Failure Mother   . Heart Problems Maternal Grandfather   . Heart Problems Maternal Grandmother   . Cancer Unknown  paternal siblings  . Cancer Brother     Home Medications:   Prior to Admission medications   Medication Sig Start Date End Date Taking? Authorizing Provider  acetaminophen (TYLENOL) 500 MG tablet Take 1,000 mg by mouth every 6 (six) hours as needed for mild pain.   Yes [provider]  alendronate (FOSAMAX) 10 MG tablet Take 10 mg by mouth daily before breakfast.  11/08/17  Yes [provider]  aspirin 325 MG tablet Take 325 mg by mouth daily.   Yes [provider]  cholecalciferol (VITAMIN D) 1000 UNITS tablet Take 1,000 Units by mouth daily.   Yes [provider]  citalopram (CELEXA) 10 MG tablet Take 10 mg by mouth daily.   Yes [provider]  donepezil (ARICEPT) 10 MG tablet TAKE 1/2 TABLET BY MOUTH EVERY DAY and TAKE 1/2 TABLET BY MOUTH EVERY EVENING Patient taking differently: Take 10 mg by mouth at bedtime.  08/06/18  Yes Dohmeier, Asencion Partridge, MD  dorzolamide (TRUSOPT) 2 % ophthalmic solution Place 1 drop into both eyes 2 (two) times daily.   Yes [provider]  latanoprost (XALATAN) 0.005 % ophthalmic solution Place 1 drop into both eyes at bedtime.   Yes [provider]  levothyroxine (SYNTHROID, LEVOTHROID) 25 MCG tablet Take 25 mcg by mouth daily before breakfast.   Yes [provider]  memantine (NAMENDA) 10 MG tablet TAKE ONE TABLET BY MOUTH TWICE DAILY Patient taking differently: Take 10 mg by mouth 2 (two) times daily.  02/28/19  Yes Dohmeier, Asencion Partridge, MD  pravastatin (PRAVACHOL) 20 MG tablet Take 1 tablet (20 mg total) by mouth every evening. 10/12/18 01/10/19  Park Liter, MD     Allergies:     No Known Allergies   Physical Exam:   Vitals  Blood pressure 107/62, pulse 62, temperature 99.9 F (37.7 C), temperature source Oral, resp. rate 17, height 5\' 4"  (1.626 m), weight 73 kg, SpO2 99 %.  Physical Exam   Constitutional - resting comfortably, no acute distress Eyes - pupils equal round and reactive to light and accomodation, extra ocular movements intact Nose - no gross deformity or drainage Mouth - no oral lesions noted Throat - no swelling or erythema Neck - supple, no JVD   CV - (+)S1S2, no murmurs  Resp - CTA bilaterally, no wheezing or crackles, GI - (+)BS, soft, non-tender, non-distended Extrem - no clubbing, cyanosis, or peripheral edema  Skin - no rashes or wounds Neuro - alert, aware, oriented to person only Psych - normal affect, no anxiety   Patient has Pressure Ulcer on Admission?: no   Data Review:    CBC Recent Labs  Lab 08/07/19 2250  WBC 4.6  HGB 13.3  HCT 42.4  PLT 300  MCV 90.0  MCH 28.2  MCHC 31.4  RDW 14.1  LYMPHSABS 0.5*  MONOABS 0.8  EOSABS 0.0  BASOSABS 0.0   ------------------------------------------------------------------------------------------------------------------  Chemistries  Recent Labs  Lab 08/07/19 2250  NA 137  K 3.4*  CL 98  CO2 28  GLUCOSE 122*  BUN 14  CREATININE 0.64  CALCIUM 9.1  AST 31  ALT 21  ALKPHOS 96  BILITOT 0.3   ------------------------------------------------------------------------------------------------------------------ estimated creatinine clearance is 54.9 mL/min (by C-G formula based on SCr of 0.64 mg/dL). ------------------------------------------------------------------------------------------------------------------ No results for input(s): TSH, T4TOTAL, T3FREE, THYROIDAB in the last 72 hours.  Invalid input(s): FREET3  Coagulation profile Recent Labs  Lab 08/07/19 2250  INR 0.9    ------------------------------------------------------------------------------------------------------------------- Recent Labs  08/07/19 2250  DDIMER 1.08*   -------------------------------------------------------------------------------------------------------------------  Cardiac Enzymes No results for input(s): CKMB, TROPONINI, MYOGLOBIN in the last 168 hours.  Invalid input(s): CK ------------------------------------------------------------------------------------------------------------------    Component Value Date/Time   BNP 54.8 08/07/2019 2250     ---------------------------------------------------------------------------------------------------------------  Urinalysis No results found for: COLORURINE, APPEARANCEUR, LABSPEC, PHURINE, GLUCOSEU, HGBUR, BILIRUBINUR, KETONESUR, PROTEINUR, UROBILINOGEN, NITRITE, LEUKOCYTESUR  ----------------------------------------------------------------------------------------------------------------   Imaging Results:    Dg Chest Port 1 View  Result Date: 08/07/2019 CLINICAL DATA:  Suspected sepsis EXAM: PORTABLE CHEST 1 VIEW COMPARISON:  10/09/2018 FINDINGS: Bibasilar atelectasis. Low lung volumes. Heart is normal size. No effusions or pneumothorax. No acute bony abnormality. IMPRESSION: Low volumes with bibasilar atelectasis. Electronically Signed   By: Rolm Baptise M.D.   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  Crackles on exam, CXR with bilateral basilar infiltrates, tested positive for SARS-CoV-2.  Sats in the upper 80s on room air.  Admit patient for supportive care and  treatment. Date of Dx: *08/07/2019 Oxygen requirements: 2 LPM Antibiotics: N/A Diuretics: N/A  Vitamin C and Zinc: Per protocol Remdesivir: Started on 08/08/2019 Steroids: Started on 08/08/2019 Actemra: Not given yet Convalescent Plasma: Not given yet DVT Prophylaxis:   Lovenox    Memory loss/Alzheimer disease: Avoid medications that may induce delirium.  Continue care Celexa, Aricept, Namenda.  DVT Prophylaxis  Lovenox  AM Labs Ordered, also please review Full Orders  Family Communication: Admission, patients condition and plan of care including tests being ordered have been discussed with the patient's son who indicate understanding and agree with the plan and Code Status.  Code Status DNAR  Likely DC to  SNF  Condition GUARDED    Admission status: Admit to inpatient  Time spent in minutes : 55   Peyton Bottoms M.D on 08/08/2019 at 4:09 AM  To page go to [email or web address] - password [facility]

## 2019-08-09 LAB — COMPREHENSIVE METABOLIC PANEL
ALT: 22 U/L (ref 0–44)
AST: 32 U/L (ref 15–41)
Albumin: 3.2 g/dL — ABNORMAL LOW (ref 3.5–5.0)
Alkaline Phosphatase: 84 U/L (ref 38–126)
Anion gap: 14 (ref 5–15)
BUN: 14 mg/dL (ref 8–23)
CO2: 26 mmol/L (ref 22–32)
Calcium: 8.5 mg/dL — ABNORMAL LOW (ref 8.9–10.3)
Chloride: 99 mmol/L (ref 98–111)
Creatinine, Ser: 0.72 mg/dL (ref 0.44–1.00)
GFR calc Af Amer: >60 mL/min (ref >60)
GFR calc non Af Amer: >60 mL/min (ref >60)
Glucose, Bld: 90 mg/dL (ref 70–99)
Potassium: 3.2 mmol/L — ABNORMAL LOW (ref 3.5–5.1)
Sodium: 139 mmol/L (ref 135–145)
Total Bilirubin: 0.8 mg/dL (ref 0.3–1.2)
Total Protein: 5.8 g/dL — ABNORMAL LOW (ref 6.5–8.1)

## 2019-08-09 LAB — URINE CULTURE

## 2019-08-09 LAB — CBC WITH DIFFERENTIAL/PLATELET
Abs Immature Granulocytes: 0.02 10*3/uL (ref 0.00–0.07)
Basophils Absolute: 0 10*3/uL (ref 0.0–0.1)
Basophils Relative: 1 %
Eosinophils Absolute: 0.1 10*3/uL (ref 0.0–0.5)
Eosinophils Relative: 1 %
HCT: 44.9 % (ref 36.0–46.0)
Hemoglobin: 13.7 g/dL (ref 12.0–15.0)
Immature Granulocytes: 1 %
Lymphocytes Relative: 25 %
Lymphs Abs: 1 10*3/uL (ref 0.7–4.0)
MCH: 28.1 pg (ref 26.0–34.0)
MCHC: 30.5 g/dL (ref 30.0–36.0)
MCV: 92 fL (ref 80.0–100.0)
Monocytes Absolute: 0.9 10*3/uL (ref 0.1–1.0)
Monocytes Relative: 22 %
Neutro Abs: 2.1 10*3/uL (ref 1.7–7.7)
Neutrophils Relative %: 50 %
Platelets: 303 10*3/uL (ref 150–400)
RBC: 4.88 MIL/uL (ref 3.87–5.11)
RDW: 14.2 % (ref 11.5–15.5)
WBC: 4.1 10*3/uL (ref 4.0–10.5)
nRBC: 0 % (ref 0.0–0.2)

## 2019-08-09 LAB — C-REACTIVE PROTEIN: CRP: 2.8 mg/dL — ABNORMAL HIGH (ref <1.0)

## 2019-08-09 LAB — D-DIMER, QUANTITATIVE: D-Dimer, Quant: 0.96 ug/mL-FEU — ABNORMAL HIGH (ref 0.00–0.50)

## 2019-08-09 LAB — FERRITIN: Ferritin: 48 ng/mL (ref 11–307)

## 2019-08-09 MED ORDER — POTASSIUM CHLORIDE CRYS ER 20 MEQ PO TBCR
40.0000 meq | EXTENDED_RELEASE_TABLET | Freq: Once | ORAL | Status: AC
  Administered 2019-08-09: 40 meq via ORAL
  Filled 2019-08-09: qty 2

## 2019-08-09 NOTE — Progress Notes (Signed)
PROGRESS NOTE    Leslie Roach  [id number] DOB: [birth year] DOA: 08/07/2019 PCP: [name], MD   Brief Narrative: 78 year old female with history of dementia/memory loss, hypothyroidism, low back pain/left knee pain, glaucoma sent from skilled nursing facility due to progressive shortness of breath fever chills.  Patient is a long-term resident at [facility] due to [place] outbreak with several residents with testing positive for Covid.  EMS was called, O2 was 90% on room air was placed on 4 L and transported to the ER. In the ER, chest x-ray with bibasilar infiltrates, positive for COVID-19 saturations were in upper 80s on room air, initially tachypneic in 24/min, hr 74/min.  Labs showed CRP 2.4 procalcitonin less than 0.1 D-dimer 1.0, ferritin normal at 35. Patient was admitted and started on Decadron remdesivir supplemental oxygen  Subjective:  seen this morning she is alert awake oriented to self.  Denies any complaints.  On room air.  Assessment & Plan:   COVID-19 pneumonia/Acute pulmonary insufficiency:Chest x-ray "Low volumes with bibasilar atelectasis"Continue iv Decadron, remdesivir pharmacy to dose, continue as needed supplemental oxygen, supportive care vitamin C.  DVT prophylaxis with Lovenox.  Continue airborne precaution as per protocol.  Check inflammatory markers daily- CRP 2.4->2.8, ferritin normal at 48, LDH 143. D dimer 1.08->0.96  Alzheimer's dementia/memory loss:continue supportive care, will precaution, re-orientation,continue home Celexa Aricept Namenda. Hypothyroidism continue Synthroid. HLD-continue statin Hypokalemia-repleted.  CODE STATUS:DNR.Prognosis remains to be seen. Family communication: I had updated patient's son Dr. Lin Landsman who works at [employer], over the phone on admission. Per him patient's memory care unit will be able to take her back once she is discharged. Disposition: Remains inpatient for Covid pneumonia treatment.  Obtain PT  evaluation.   Consultants: none Procedures: none Microbiology: none  Antimicrobials: Anti-infectives (From admission, onward)   Start     Dose/Rate Route Frequency Ordered Stop   08/09/19 1400  remdesivir 100 mg in sodium chloride 0.9 % 250 mL IVPB     100 mg 500 mL/hr over 30 Minutes Intravenous Every 24 hours 08/08/19 0158 08/13/19 1359   08/08/19 0200  remdesivir 200 mg in sodium chloride 0.9 % 250 mL IVPB     200 mg 500 mL/hr over 30 Minutes Intravenous Once 08/08/19 0158 08/08/19 0333       Objective: Vitals:   08/08/19 1230 08/08/19 1322 08/08/19 1940 08/09/19 0503  BP: (!) 141/94 (!) 148/75 112/63 126/87  Pulse: 61 68 60 65  Resp: 18 20 14 14   Temp:  99 F (37.2 C) 98.3 F (36.8 C) 98.5 F (36.9 C)  TempSrc:  Axillary Oral Oral  SpO2: 96% 98% 93% 92%  Weight:  83.1 kg    Height:        Intake/Output Summary (Last 24 hours) at 08/09/2019 1320 Last data filed at 08/09/2019 0841 Gross per 24 hour  Intake 0 ml  Output 600 ml  Net -600 ml   Filed Weights   08/07/19 2244 08/08/19 1322  Weight: 73 kg 83.1 kg   Weight change: 10.1 kg  Body mass index is 31.45 kg/m.  Intake/Output from previous day: 12/03 0701 - 12/04 0700 In: 0  Out: 300 [Urine:300] Intake/Output this shift: Total I/O In: -  Out: 300 [Urine:300]  Examination:  General exam: AAO to self, elderly, frail, on room air HEENT:Oral mucosa moist, Ear/Nose WNL grossly, dentition normal. Respiratory system: Diminished breath sounds at the base, mostly clear ,no use of accessory muscle Cardiovascular system: S1 & S2 +, No JVD,. Gastrointestinal  system: Abdomen soft, NT,ND, BS+ Nervous System:Alert, awake, moving extremities and grossly nonfocal Extremities: No edema, distal peripheral pulses palpable.  Skin: No rashes,no icterus. MSK: Normal muscle bulk,tone, power  Medications:  Scheduled Meds: . aspirin  325 mg Oral Daily  . citalopram  10 mg Oral Daily  . dexamethasone  6 mg Oral Q24H   . docusate sodium  100 mg Oral Daily  . donepezil  10 mg Oral QHS  . dorzolamide  1 drop Both Eyes BID  . enoxaparin (LOVENOX) injection  40 mg Subcutaneous Q24H  . Ipratropium-Albuterol  1 puff Inhalation Q6H  . latanoprost  1 drop Both Eyes QHS  . levothyroxine  25 mcg Oral Q0600  . memantine  10 mg Oral BID  . pravastatin  20 mg Oral QPM  . vitamin C  500 mg Oral Daily  . zinc sulfate  220 mg Oral Daily   Continuous Infusions: . remdesivir 100 mg in NS 100 mL 100 mg (08/09/19 1311)    Data Reviewed: I have personally reviewed following labs and imaging studies  CBC: Recent Labs  Lab 08/07/19 2250 08/09/19 0412  WBC 4.6 4.1  NEUTROABS 3.3 2.1  HGB 13.3 13.7  HCT 42.4 44.9  MCV 90.0 92.0  PLT 300 [id number]   Basic Metabolic Panel: Recent Labs  Lab 08/07/19 2250 08/09/19 0412  NA 137 139  K 3.4* 3.2*  CL 98 99  CO2 28 26  GLUCOSE 122* 90  BUN 14 14  CREATININE 0.64 0.72  CALCIUM 9.1 8.5*   GFR: Estimated Creatinine Clearance: 58.5 mL/min (by C-G formula based on SCr of 0.72 mg/dL). Liver Function Tests: Recent Labs  Lab 08/07/19 2250 08/09/19 0412  AST 31 32  ALT 21 22  ALKPHOS 96 84  BILITOT 0.3 0.8  PROT 6.5 5.8*  ALBUMIN 3.6 3.2*   No results for input(s): LIPASE, AMYLASE in the last 168 hours. No results for input(s): AMMONIA in the last 168 hours. Coagulation Profile: Recent Labs  Lab 08/07/19 2250  INR 0.9   Cardiac Enzymes: No results for input(s): CKTOTAL, CKMB, CKMBINDEX, TROPONINI in the last 168 hours. BNP (last 3 results) No results for input(s): PROBNP in the last 8760 hours. HbA1C: No results for input(s): HGBA1C in the last 72 hours. CBG: Recent Labs  Lab 08/08/19 1937  GLUCAP 111*   Lipid Profile: No results for input(s): CHOL, HDL, LDLCALC, TRIG, CHOLHDL, LDLDIRECT in the last 72 hours. Thyroid Function Tests: No results for input(s): TSH, T4TOTAL, FREET4, T3FREE, THYROIDAB in the last 72 hours. Anemia Panel: Recent Labs     08/07/19 2250 08/09/19 0412  FERRITIN 35 48   Sepsis Labs: Recent Labs  Lab 08/07/19 2250 08/08/19 0050  PROCALCITON <0.10  --   LATICACIDVEN 0.9 1.1    Recent Results (from the past 240 hour(s))  Culture, blood (Routine x 2)     Status: None (Preliminary result)   Collection Time: 08/07/19 11:30 PM   Specimen: BLOOD  Result Value Ref Range Status   Specimen Description   Final    BLOOD LEFT ANTECUBITAL Performed at [facility], [place] [address] [place], [place] [address]    Special Requests   Final    BOTTLES DRAWN AEROBIC AND ANAEROBIC Blood Culture results may not be optimal due to an excessive volume of blood received in culture bottles Performed at [place] [address], [place], [place] [address]    Culture   Final    NO  GROWTH 1 DAY Performed at [facility], [place] [address] [place], [place] [address]    Report Status PENDING  Incomplete  Culture, blood (Routine x 2)     Status: None (Preliminary result)   Collection Time: 08/07/19 11:30 PM   Specimen: BLOOD LEFT WRIST  Result Value Ref Range Status   Specimen Description   Final    BLOOD LEFT WRIST Performed at [place] [address], [place], [place] [address]    Special Requests   Final    BOTTLES DRAWN AEROBIC AND ANAEROBIC Blood Culture adequate volume Performed at [place] [address], [place], [place] [address]    Culture   Final    NO GROWTH 1 DAY Performed at [facility], [place] [address], [place], [place] [address]    Report Status PENDING  Incomplete  Urine culture     Status: Abnormal   Collection Time: 08/08/19  5:36 AM   Specimen: Urine, Clean Catch  Result Value Ref Range Status   Specimen Description   Final    URINE, CLEAN CATCH Performed at [facility], [place] [address] [place], [place] [address]    Special Requests   Final    NONE Performed at [facility], [place] [address], [place], [place] [address]    Culture MULTIPLE SPECIES PRESENT, SUGGEST RECOLLECTION (A)  Final   Report Status 08/09/2019 FINAL  Final  MRSA PCR Screening     Status: None   Collection Time: 08/08/19  1:39 PM   Specimen: Nasal Mucosa; Nasopharyngeal  Result Value Ref Range Status   MRSA by PCR NEGATIVE NEGATIVE Final    Comment:        The GeneXpert MRSA Assay (FDA approved for NASAL specimens only), is one component of a comprehensive MRSA colonization surveillance program. It is not intended to diagnose MRSA infection nor to guide or monitor treatment for MRSA infections. Performed at [facility], [place] [address], [place], [place] [address]       Radiology Studies: Dg Chest Port 1 View  Result Date: 08/07/2019 CLINICAL DATA:  Suspected sepsis EXAM: PORTABLE CHEST 1 VIEW COMPARISON:  10/09/2018 FINDINGS: Bibasilar atelectasis. Low lung volumes. Heart is normal size. No effusions or pneumothorax. No acute bony abnormality. IMPRESSION: Low volumes with bibasilar atelectasis. Electronically Signed   By: Rolm Baptise M.D.   On: 08/07/2019 23:11      LOS: 1 day   Time spent: More than 50% of that time was spent in counseling and/or coordination of care.  Antonieta Pert, MD [facility]  08/09/2019, 1:20 PM

## 2019-08-10 LAB — COMPREHENSIVE METABOLIC PANEL
ALT: 19 U/L (ref 0–44)
AST: 27 U/L (ref 15–41)
Albumin: 2.9 g/dL — ABNORMAL LOW (ref 3.5–5.0)
Alkaline Phosphatase: 75 U/L (ref 38–126)
Anion gap: 9 (ref 5–15)
BUN: 23 mg/dL (ref 8–23)
CO2: 29 mmol/L (ref 22–32)
Calcium: 8.5 mg/dL — ABNORMAL LOW (ref 8.9–10.3)
Chloride: 101 mmol/L (ref 98–111)
Creatinine, Ser: 0.59 mg/dL (ref 0.44–1.00)
GFR calc Af Amer: >60 mL/min (ref >60)
GFR calc non Af Amer: >60 mL/min (ref >60)
Glucose, Bld: 93 mg/dL (ref 70–99)
Potassium: 3.4 mmol/L — ABNORMAL LOW (ref 3.5–5.1)
Sodium: 139 mmol/L (ref 135–145)
Total Bilirubin: 0.4 mg/dL (ref 0.3–1.2)
Total Protein: 5.4 g/dL — ABNORMAL LOW (ref 6.5–8.1)

## 2019-08-10 LAB — CBC WITH DIFFERENTIAL/PLATELET
Abs Immature Granulocytes: 0.01 10*3/uL (ref 0.00–0.07)
Basophils Absolute: 0 10*3/uL (ref 0.0–0.1)
Basophils Relative: 0 %
Eosinophils Absolute: 0 10*3/uL (ref 0.0–0.5)
Eosinophils Relative: 0 %
HCT: 39.4 % (ref 36.0–46.0)
Hemoglobin: 12.1 g/dL (ref 12.0–15.0)
Immature Granulocytes: 0 %
Lymphocytes Relative: 27 %
Lymphs Abs: 1.1 10*3/uL (ref 0.7–4.0)
MCH: 27.8 pg (ref 26.0–34.0)
MCHC: 30.7 g/dL (ref 30.0–36.0)
MCV: 90.6 fL (ref 80.0–100.0)
Monocytes Absolute: 0.6 10*3/uL (ref 0.1–1.0)
Monocytes Relative: 16 %
Neutro Abs: 2.3 10*3/uL (ref 1.7–7.7)
Neutrophils Relative %: 57 %
Platelets: 326 10*3/uL (ref 150–400)
RBC: 4.35 MIL/uL (ref 3.87–5.11)
RDW: 14 % (ref 11.5–15.5)
WBC: 4 10*3/uL (ref 4.0–10.5)
nRBC: 0 % (ref 0.0–0.2)

## 2019-08-10 LAB — C-REACTIVE PROTEIN: CRP: 1.3 mg/dL — ABNORMAL HIGH (ref <1.0)

## 2019-08-10 LAB — FERRITIN: Ferritin: 47 ng/mL (ref 11–307)

## 2019-08-10 LAB — D-DIMER, QUANTITATIVE: D-Dimer, Quant: 0.96 ug/mL-FEU — ABNORMAL HIGH (ref 0.00–0.50)

## 2019-08-10 MED ORDER — SODIUM CHLORIDE 0.9 % IV SOLN
100.0000 mg | Freq: Every day | INTRAVENOUS | Status: AC
  Administered 2019-08-10 – 2019-08-12 (×3): 100 mg via INTRAVENOUS
  Filled 2019-08-10 (×3): qty 100

## 2019-08-10 NOTE — Evaluation (Signed)
Physical Therapy Evaluation Patient Details Name: Leslie Roach MRN: [id number] DOB: [birth year] Today's Date: 08/10/2019   History of Present Illness  78 year old female with history of dementia/memory loss, hypothyroidism, low back pain/left knee pain, glaucoma sent from skilled nursing facility due to progressive shortness of breath fever chills.  Patient is a long-term resident at [facility] due to [place] outbreak with several residents with testing positive for Covid.  EMS was called, O2 was 90% on room air was placed on 4 L and transported to the ER where she was diagnosed with COVID.  Clinical Impression  Pt admitted with above diagnosis.  Pt currently with functional limitations due to the deficits listed below (see PT Problem List). Pt will benefit from skilled PT to increase their independence and safety with mobility to allow discharge to the venue listed below.  Pt was able to ambulate in her room with RW and MIN A on room air with o2 sat 94%.  She was shaky with gait and would recommend A for gait and short term rehab at her memory unit until she gets stronger.       Follow Up Recommendations SNF;Supervision for mobility/OOB    Equipment Recommendations  None recommended by PT    Recommendations for Other Services       Precautions / Restrictions Precautions Precautions: Fall Restrictions Weight Bearing Restrictions: No      Mobility  Bed Mobility Overal bed mobility: Needs Assistance Bed Mobility: Supine to Sit     Supine to sit: Min assist     General bed mobility comments: MIN A for trunk to get fully upright.  MIN A to scoot to EOB.  Transfers Overall transfer level: Needs assistance Equipment used: Rolling walker (2 wheeled) Transfers: Sit to/from Stand Sit to Stand: Min guard;From elevated surface            Ambulation/Gait Ambulation/Gait assistance: Min Web designer (Feet): 40 Feet Assistive device: Rolling walker (2  wheeled) Gait Pattern/deviations: Decreased step length - right;Decreased stride length Gait velocity: decreased   General Gait Details: Pt with some shakiness during gait.  At times, she would take hand off of RW to reach out and fiddle with something.  Cues for safe turning when returning to the bed.  Stairs            Wheelchair Mobility    Modified Rankin (Stroke Patients Only)       Balance Overall balance assessment: Needs assistance Sitting-balance support: Feet supported Sitting balance-Leahy Scale: Fair     Standing balance support: Bilateral upper extremity supported Standing balance-Leahy Scale: Poor Standing balance comment: requires UE support                             Pertinent Vitals/Pain Pain Assessment: No/denies pain    Home Living Family/patient expects to be discharged to:: Skilled nursing facility                 Additional Comments: memory Unit    Prior Function           Comments: Unsure.     Hand Dominance        Extremity/Trunk Assessment   Upper Extremity Assessment Upper Extremity Assessment: Overall WFL for tasks assessed    Lower Extremity Assessment Lower Extremity Assessment: Overall WFL for tasks assessed;Generalized weakness       Communication   Communication: No difficulties  Cognition Arousal/Alertness: Awake/alert Behavior During Therapy: [facility]  for tasks assessed/performed Overall Cognitive Status: History of cognitive impairments - at baseline                                        General Comments      Exercises     Assessment/Plan    PT Assessment Patient needs continued PT services  PT Problem List Decreased strength;Decreased activity tolerance;Decreased balance;Decreased mobility;Decreased cognition;Decreased knowledge of use of DME;Decreased safety awareness       PT Treatment Interventions Gait training;DME instruction;Functional mobility training;Balance  training;Therapeutic exercise;Therapeutic activities    PT Goals (Current goals can be found in the Care Plan section)  Acute Rehab PT Goals PT Goal Formulation: Patient unable to participate in goal setting Time For Goal Achievement: 2019/09/11 Potential to Achieve Goals: Good    Frequency Min 2X/week   Barriers to discharge        Co-evaluation               AM-PAC PT "6 Clicks" Mobility  Outcome Measure Help needed turning from your back to your side while in a flat bed without using bedrails?: A Little Help needed moving from lying on your back to sitting on the side of a flat bed without using bedrails?: A Little Help needed moving to and from a bed to a chair (including a wheelchair)?: A Little Help needed standing up from a chair using your arms (e.g., wheelchair or bedside chair)?: A Little Help needed to walk in hospital room?: A Little Help needed climbing 3-5 steps with a railing? : A Little 6 Click Score: 18    End of Session Equipment Utilized During Treatment: Gait belt Activity Tolerance: Patient tolerated treatment well Patient left: in bed;with call bell/phone within reach;with bed alarm set Nurse Communication: Mobility status PT Visit Diagnosis: Unsteadiness on feet (R26.81);Other abnormalities of gait and mobility (R26.89)    Time: 1330-1407 PT Time Calculation (min) (ACUTE ONLY): 37 min   Charges:   PT Evaluation $PT Eval Moderate Complexity: 1 Mod PT Treatments $Gait Training: 23-37 mins        Karrigan Messamore L. Tamala Julian, [place] Pager [phone] 08/10/2019   Galen Manila 08/10/2019, 2:29 PM

## 2019-08-10 NOTE — Progress Notes (Signed)
PROGRESS NOTE    Leslie Roach  [id number] DOB: [birth year] DOA: 08/07/2019 PCP: [name], MD   Brief Narrative: 78 year old female with history of dementia/memory loss, hypothyroidism, low back pain/left knee pain, glaucoma sent from skilled nursing facility due to progressive shortness of breath fever chills.  Patient is a long-term resident at [facility] due to [place] outbreak with several residents with testing positive for Covid.  EMS was called, O2 was 90% on room air was placed on 4 L and transported to the ER. In the ER, chest x-ray with bibasilar infiltrates, positive for COVID-19 saturations were in upper 80s on room air, initially tachypneic in 24/min, hr 74/min.  Labs showed CRP 2.4 procalcitonin less than 0.1 D-dimer 1.0, ferritin normal at 35. Patient was admitted and started on Decadron remdesivir supplemental oxygen  Subjective: No acute events overnight.  Resting comfortably feeding herself.  Alert awake oriented to self  Assessment & Plan:   COVID-19 pneumonia/Acute pulmonary insufficiency:Chest x-ray "Low volumes with bibasilar atelectasis"Continue iv Decadron, remdesivir pharmacy to dose,. Able to come off oxygen. Continue as needed supplemental oxygen, supportive care vitamin C.  DVT prophylaxis with Lovenox.  Continue airborne precaution as per protocol. Inflammatory markers improving with- CRP 2.4->2.8->1.3, ferritin normal at 47, LDH 143. D dimer 1.08->0.96  Alzheimer's dementia/memory loss: Remains at baseline, continue supportive care, will precaution, re-orientation,continue home Celexa Aricept Namenda. Hypothyroidism continue Synthroid. HLD-continue statin Hypokalemia-k at 3.4, replete orally  CODE STATUS:DNR.Prognosis remains to be seen. Family communication: I had updated patient's son Dr. Lin Landsman who works at [employer], over the phone on admission. Per him patient's memory care unit will be able to take her back once she is discharged.  Disposition: Continue PT OT evaluation-pending eval. Plan on d/c back to memory care unit soon. Consult CM.  Discussed with the nursing staff.  Consultants: none Procedures: none Microbiology: none  Antimicrobials: Anti-infectives (From admission, onward)   Start     Dose/Rate Route Frequency Ordered Stop   08/10/19 1000  remdesivir 100 mg in sodium chloride 0.9 % 100 mL IVPB     100 mg 200 mL/hr over 30 Minutes Intravenous Daily 08/10/19 0659 08/13/19 0959   08/09/19 1400  remdesivir 100 mg in sodium chloride 0.9 % 250 mL IVPB  Status:  Discontinued     100 mg 500 mL/hr over 30 Minutes Intravenous Every 24 hours 08/08/19 0158 08/10/19 0659   08/08/19 0200  remdesivir 200 mg in sodium chloride 0.9 % 250 mL IVPB     200 mg 500 mL/hr over 30 Minutes Intravenous Once 08/08/19 0158 08/08/19 0333       Objective: Vitals:   08/09/19 0503 08/09/19 1325 08/09/19 2047 08/10/19 0611  BP: 126/87 123/73 135/76 125/72  Pulse: 65 82 89 67  Resp: 14 20 20  (!) 22  Temp: 98.5 F (36.9 C) 98.1 F (36.7 C) 98.7 F (37.1 C) 98.1 F (36.7 C)  TempSrc: Oral Oral Oral Oral  SpO2: 92% 92% 91% 95%  Weight:      Height:        Intake/Output Summary (Last 24 hours) at 08/10/2019 1141 Last data filed at 08/09/2019 1600 Gross per 24 hour  Intake 670.71 ml  Output -  Net 670.71 ml   Filed Weights   08/07/19 2244 08/08/19 1322  Weight: 73 kg 83.1 kg   Weight change:   Body mass index is 31.45 kg/m.  Intake/Output from previous day: 12/04 0701 - 12/05 0700 In: 670.7 [P.O.:410; IV Piggyback:260.7] Out: 300 [  Urine:300] Intake/Output this shift: No intake/output data recorded.  Examination:  General exam: Alert, oriented to self, on the unit, not in acute distress.  Able to answer few questions.  HEENT:Oral mucosa moist, Ear/Nose WNL grossly, dentition normal. Respiratory system: Bilaterally clear breath sounds,no use of accessory muscle Cardiovascular system: S1 & S2 +, No JVD,.  Gastrointestinal system: Abdomen soft, NT,ND, BS+ Nervous System:Alert, awake, moving extremities and grossly nonfocal Extremities: No edema, distal peripheral pulses palpable.  Skin: No rashes,no icterus. MSK: Normal muscle bulk,tone, power  Medications:  Scheduled Meds: . aspirin  325 mg Oral Daily  . citalopram  10 mg Oral Daily  . dexamethasone  6 mg Oral Q24H  . docusate sodium  100 mg Oral Daily  . donepezil  10 mg Oral QHS  . dorzolamide  1 drop Both Eyes BID  . enoxaparin (LOVENOX) injection  40 mg Subcutaneous Q24H  . Ipratropium-Albuterol  1 puff Inhalation Q6H  . latanoprost  1 drop Both Eyes QHS  . levothyroxine  25 mcg Oral Q0600  . memantine  10 mg Oral BID  . pravastatin  20 mg Oral QPM  . vitamin C  500 mg Oral Daily  . zinc sulfate  220 mg Oral Daily   Continuous Infusions: . remdesivir 100 mg in NS 100 mL 100 mg (08/10/19 1051)    Data Reviewed: I have personally reviewed following labs and imaging studies  CBC: Recent Labs  Lab 08/07/19 2250 08/09/19 0412 08/10/19 0330  WBC 4.6 4.1 4.0  NEUTROABS 3.3 2.1 2.3  HGB 13.3 13.7 12.1  HCT 42.4 44.9 39.4  MCV 90.0 92.0 90.6  PLT 300 303 [id number]   Basic Metabolic Panel: Recent Labs  Lab 08/07/19 2250 08/09/19 0412 08/10/19 0330  NA 137 139 139  K 3.4* 3.2* 3.4*  CL 98 99 101  CO2 28 26 29   GLUCOSE 122* 90 93  BUN 14 14 23   CREATININE 0.64 0.72 0.59  CALCIUM 9.1 8.5* 8.5*   GFR: Estimated Creatinine Clearance: 58.5 mL/min (by C-G formula based on SCr of 0.59 mg/dL). Liver Function Tests: Recent Labs  Lab 08/07/19 2250 08/09/19 0412 08/10/19 0330  AST 31 32 27  ALT 21 22 19   ALKPHOS 96 84 75  BILITOT 0.3 0.8 0.4  PROT 6.5 5.8* 5.4*  ALBUMIN 3.6 3.2* 2.9*   No results for input(s): LIPASE, AMYLASE in the last 168 hours. No results for input(s): AMMONIA in the last 168 hours. Coagulation Profile: Recent Labs  Lab 08/07/19 2250  INR 0.9   Cardiac Enzymes: No results for input(s):  CKTOTAL, CKMB, CKMBINDEX, TROPONINI in the last 168 hours. BNP (last 3 results) No results for input(s): PROBNP in the last 8760 hours. HbA1C: No results for input(s): HGBA1C in the last 72 hours. CBG: Recent Labs  Lab 08/08/19 1937  GLUCAP 111*   Lipid Profile: No results for input(s): CHOL, HDL, LDLCALC, TRIG, CHOLHDL, LDLDIRECT in the last 72 hours. Thyroid Function Tests: No results for input(s): TSH, T4TOTAL, FREET4, T3FREE, THYROIDAB in the last 72 hours. Anemia Panel: Recent Labs    08/09/19 0412 08/10/19 0330  FERRITIN 48 47   Sepsis Labs: Recent Labs  Lab 08/07/19 2250 08/08/19 0050  PROCALCITON <0.10  --   LATICACIDVEN 0.9 1.1    Recent Results (from the past 240 hour(s))  Culture, blood (Routine x 2)     Status: None (Preliminary result)   Collection Time: 08/07/19 11:30 PM   Specimen: BLOOD  Result Value Ref Range Status  Specimen Description   Final    BLOOD LEFT ANTECUBITAL Performed at [facility] [address], [place], [place] [address]    Special Requests   Final    BOTTLES DRAWN AEROBIC AND ANAEROBIC Blood Culture results may not be optimal due to an excessive volume of blood received in culture bottles Performed at [facility] [address], [place], [place] [address]    Culture   Final    NO GROWTH 1 DAY Performed at [facility], [place] [address] [place], [place] [address]    Report Status PENDING  Incomplete  Culture, blood (Routine x 2)     Status: None (Preliminary result)   Collection Time: 08/07/19 11:30 PM   Specimen: BLOOD LEFT WRIST  Result Value Ref Range Status   Specimen Description   Final    BLOOD LEFT WRIST Performed at [facility] [address], [place], [place] [address]    Special Requests   Final    BOTTLES DRAWN AEROBIC AND ANAEROBIC Blood Culture adequate volume Performed at [facility] [address], [place], [place] [address]     Culture   Final    NO GROWTH 1 DAY Performed at [facility], [place] [address], [place], [place] [address]    Report Status PENDING  Incomplete  Urine culture     Status: Abnormal   Collection Time: 08/08/19  5:36 AM   Specimen: Urine, Clean Catch  Result Value Ref Range Status   Specimen Description   Final    URINE, CLEAN CATCH Performed at [facility], [place] [address], [place], [place] [address]    Special Requests   Final    NONE Performed at [facility], [place] [address], [place], [place] [address]    Culture MULTIPLE SPECIES PRESENT, SUGGEST RECOLLECTION (A)  Final   Report Status 08/09/2019 FINAL  Final  MRSA PCR Screening     Status: None   Collection Time: 08/08/19  1:39 PM   Specimen: Nasal Mucosa; Nasopharyngeal  Result Value Ref Range Status   MRSA by PCR NEGATIVE NEGATIVE Final    Comment:        The GeneXpert MRSA Assay (FDA approved for NASAL specimens only), is one component of a comprehensive MRSA colonization surveillance program. It is not intended to diagnose MRSA infection nor to guide or monitor treatment for MRSA infections. Performed at [facility], [place] [address] [place], [place] [address]       Radiology Studies: No results found.    LOS: 2 days   Time spent: More than 50% of that time was spent in counseling and/or coordination of care.  Antonieta Pert, MD [facility]  08/10/2019, 11:41 AM

## 2019-08-11 LAB — FERRITIN: Ferritin: 49 ng/mL (ref 11–307)

## 2019-08-11 LAB — COMPREHENSIVE METABOLIC PANEL WITH GFR
ALT: 18 U/L (ref 0–44)
AST: 26 U/L (ref 15–41)
Albumin: 3.4 g/dL — ABNORMAL LOW (ref 3.5–5.0)
Alkaline Phosphatase: 77 U/L (ref 38–126)
Anion gap: 11 (ref 5–15)
BUN: 19 mg/dL (ref 8–23)
CO2: 30 mmol/L (ref 22–32)
Calcium: 8.7 mg/dL — ABNORMAL LOW (ref 8.9–10.3)
Chloride: 99 mmol/L (ref 98–111)
Creatinine, Ser: 0.61 mg/dL (ref 0.44–1.00)
GFR calc Af Amer: >60 mL/min
GFR calc non Af Amer: >60 mL/min
Glucose, Bld: 93 mg/dL (ref 70–99)
Potassium: 3.5 mmol/L (ref 3.5–5.1)
Sodium: 140 mmol/L (ref 135–145)
Total Bilirubin: 0.4 mg/dL (ref 0.3–1.2)
Total Protein: 6 g/dL — ABNORMAL LOW (ref 6.5–8.1)

## 2019-08-11 LAB — C-REACTIVE PROTEIN: CRP: 0.8 mg/dL

## 2019-08-11 LAB — CBC WITH DIFFERENTIAL/PLATELET
Abs Immature Granulocytes: 0.02 K/uL (ref 0.00–0.07)
Basophils Absolute: 0 K/uL (ref 0.0–0.1)
Basophils Relative: 1 %
Eosinophils Absolute: 0 K/uL (ref 0.0–0.5)
Eosinophils Relative: 0 %
HCT: 42.6 % (ref 36.0–46.0)
Hemoglobin: 13.3 g/dL (ref 12.0–15.0)
Immature Granulocytes: 0 %
Lymphocytes Relative: 25 %
Lymphs Abs: 1.2 K/uL (ref 0.7–4.0)
MCH: 28.2 pg (ref 26.0–34.0)
MCHC: 31.2 g/dL (ref 30.0–36.0)
MCV: 90.4 fL (ref 80.0–100.0)
Monocytes Absolute: 0.7 K/uL (ref 0.1–1.0)
Monocytes Relative: 14 %
Neutro Abs: 2.9 K/uL (ref 1.7–7.7)
Neutrophils Relative %: 60 %
Platelets: 374 K/uL (ref 150–400)
RBC: 4.71 MIL/uL (ref 3.87–5.11)
RDW: 13.9 % (ref 11.5–15.5)
WBC: 4.9 K/uL (ref 4.0–10.5)
nRBC: 0 % (ref 0.0–0.2)

## 2019-08-11 LAB — D-DIMER, QUANTITATIVE: D-Dimer, Quant: 0.87 ug{FEU}/mL — ABNORMAL HIGH (ref 0.00–0.50)

## 2019-08-11 NOTE — Progress Notes (Signed)
PROGRESS NOTE    Leslie Roach  [id number] DOB: 30-Jun-1941 DOA: 08/07/2019 PCP: [name], MD   Brief Narrative: 78 year old female with history of dementia/memory loss, hypothyroidism, low back pain/left knee pain, glaucoma sent from skilled nursing facility due to progressive shortness of breath fever chills.  Patient is a long-term resident at [facility] due to [place] outbreak with several residents with testing positive for Covid.  EMS was called, O2 was 90% on room air was placed on 4 L and transported to the ER. In the ER, chest x-ray with bibasilar infiltrates, positive for COVID-19 saturations were in upper 80s on room air, initially tachypneic in 24/min, hr 74/min.  Labs showed CRP 2.4 procalcitonin less than 0.1 D-dimer 1.0, ferritin normal at 35. Patient was admitted and started on Decadron remdesivir supplemental oxygen  Subjective: No acute events overnight. Patient is alert awake with baseline dementia, on room air.  Assessment & Plan:   COVID-19 pneumonia/Acute pulmonary insufficiency:Chest x-ray "Low volumes with bibasilar atelectasis" Continue iv Decadron, remdesivir pharmacy to dose,. She is able to come off oxygen. Continue as needed supplemental oxygen, supportive care vitamin C.  DVT prophylaxis with Lovenox.  Continue airborne precaution as per protocol. Inflammatory markers improving as below Recent Labs    08/09/19 0412 08/10/19 0330 08/11/19 0440  DDIMER 0.96* 0.96* 0.87*  FERRITIN 48 47 49  CRP 2.8* 1.3* 0.8   Alzheimer's dementia/memory loss: Remains at baseline, continue supportive care, will precaution, re-orientation,continue home Celexa Aricept Namenda. Hypothyroidism continue Synthroid. HLD-continue statin Hypokalemia-k at 3.4, replete orally  CODE STATUS:DNR.Prognosis remains to be seen. Family communication: I had updated patient's son Dr. Lin Landsman who works at [employer], over the phone on admission. Per him patient's memory  care unit will be able to take her back once she is discharged.  Patient needs to be off sitter for 24 hours for return to SNF Disposition: Return to skilled nursing facility/memory care tomorrow once of seizure   Consultants: none Procedures: none Microbiology: none  Antimicrobials: Anti-infectives (From admission, onward)   Start     Dose/Rate Route Frequency Ordered Stop   08/10/19 1000  remdesivir 100 mg in sodium chloride 0.9 % 100 mL IVPB     100 mg 200 mL/hr over 30 Minutes Intravenous Daily 08/10/19 0659 08/13/19 0959   08/09/19 1400  remdesivir 100 mg in sodium chloride 0.9 % 250 mL IVPB  Status:  Discontinued     100 mg 500 mL/hr over 30 Minutes Intravenous Every 24 hours 08/08/19 0158 08/10/19 0659   08/08/19 0200  remdesivir 200 mg in sodium chloride 0.9 % 250 mL IVPB     200 mg 500 mL/hr over 30 Minutes Intravenous Once 08/08/19 0158 08/08/19 0333       Objective: Vitals:   08/10/19 2055 08/11/19 0443 08/11/19 0601 08/11/19 1204  BP: 132/79 (!) 179/89 (!) 144/90 (!) 134/96  Pulse: 75 60 64 70  Resp: 20 20  20   Temp: 97.9 F (36.6 C) 98.2 F (36.8 C)  98.1 F (36.7 C)  TempSrc: Oral Oral  Oral  SpO2: 93% 92%  94%  Weight:      Height:        Intake/Output Summary (Last 24 hours) at 08/11/2019 1503 Last data filed at 08/11/2019 0445 Gross per 24 hour  Intake 0 ml  Output 600 ml  Net -600 ml   Filed Weights   08/07/19 2244 08/08/19 1322  Weight: 73 kg 83.1 kg   Weight change:   Body  mass index is 31.45 kg/m.  Intake/Output from previous day: 12/05 0701 - 12/06 0700 In: 0  Out: 600 [Urine:600] Intake/Output this shift: No intake/output data recorded.  Examination:  General exam: Alert awake oriented to self, not in acute distress, follows minimal commands  HEENT:Oral mucosa moist, Ear/Nose WNL grossly, dentition normal. Respiratory system: Bilaterally clear breath sounds no use of accessory muscle, no wheezing  Cardiovascular system: S1 & S2 +,  No JVD,. Gastrointestinal system: Abdomen soft, NT,ND, BS+ Nervous System:Alert, awake, moving extremities and is grossly nonfocal Extremities: No edema, distal peripheral pulses palpable.  Skin: No rashes,no icterus. MSK: Normal muscle bulk,tone, power  Medications:  Scheduled Meds: . aspirin  325 mg Oral Daily  . citalopram  10 mg Oral Daily  . dexamethasone  6 mg Oral Q24H  . docusate sodium  100 mg Oral Daily  . donepezil  10 mg Oral QHS  . dorzolamide  1 drop Both Eyes BID  . enoxaparin (LOVENOX) injection  40 mg Subcutaneous Q24H  . Ipratropium-Albuterol  1 puff Inhalation Q6H  . latanoprost  1 drop Both Eyes QHS  . levothyroxine  25 mcg Oral Q0600  . memantine  10 mg Oral BID  . pravastatin  20 mg Oral QPM  . vitamin C  500 mg Oral Daily  . zinc sulfate  220 mg Oral Daily   Continuous Infusions: . remdesivir 100 mg in NS 100 mL 100 mg (08/11/19 1107)    Data Reviewed: I have personally reviewed following labs and imaging studies  CBC: Recent Labs  Lab 08/07/19 2250 08/09/19 0412 08/10/19 0330 08/11/19 0440  WBC 4.6 4.1 4.0 4.9  NEUTROABS 3.3 2.1 2.3 2.9  HGB 13.3 13.7 12.1 13.3  HCT 42.4 44.9 39.4 42.6  MCV 90.0 92.0 90.6 90.4  PLT 300 303 326 [id number]   Basic Metabolic Panel: Recent Labs  Lab 08/07/19 2250 08/09/19 0412 08/10/19 0330 08/11/19 0440  NA 137 139 139 140  K 3.4* 3.2* 3.4* 3.5  CL 98 99 101 99  CO2 28 26 29 30   GLUCOSE 122* 90 93 93  BUN 14 14 23 19   CREATININE 0.64 0.72 0.59 0.61  CALCIUM 9.1 8.5* 8.5* 8.7*   GFR: Estimated Creatinine Clearance: 58.5 mL/min (by C-G formula based on SCr of 0.61 mg/dL). Liver Function Tests: Recent Labs  Lab 08/07/19 2250 08/09/19 0412 08/10/19 0330 08/11/19 0440  AST 31 32 27 26  ALT 21 22 19 18   ALKPHOS 96 84 75 77  BILITOT 0.3 0.8 0.4 0.4  PROT 6.5 5.8* 5.4* 6.0*  ALBUMIN 3.6 3.2* 2.9* 3.4*   No results for input(s): LIPASE, AMYLASE in the last 168 hours. No results for input(s): AMMONIA  in the last 168 hours. Coagulation Profile: Recent Labs  Lab 08/07/19 2250  INR 0.9   Cardiac Enzymes: No results for input(s): CKTOTAL, CKMB, CKMBINDEX, TROPONINI in the last 168 hours. BNP (last 3 results) No results for input(s): PROBNP in the last 8760 hours. HbA1C: No results for input(s): HGBA1C in the last 72 hours. CBG: Recent Labs  Lab 08/08/19 1937  GLUCAP 111*   Lipid Profile: No results for input(s): CHOL, HDL, LDLCALC, TRIG, CHOLHDL, LDLDIRECT in the last 72 hours. Thyroid Function Tests: No results for input(s): TSH, T4TOTAL, FREET4, T3FREE, THYROIDAB in the last 72 hours. Anemia Panel: Recent Labs    08/10/19 0330 08/11/19 0440  FERRITIN 47 49   Sepsis Labs: Recent Labs  Lab 08/07/19 2250 08/08/19 0050  PROCALCITON <0.10  --  LATICACIDVEN 0.9 1.1    Recent Results (from the past 240 hour(s))  Culture, blood (Routine x 2)     Status: None (Preliminary result)   Collection Time: 08/07/19 11:30 PM   Specimen: BLOOD  Result Value Ref Range Status   Specimen Description   Final    BLOOD LEFT ANTECUBITAL Performed at [facility] [address] [place], [place] [address]    Special Requests   Final    BOTTLES DRAWN AEROBIC AND ANAEROBIC Blood Culture results may not be optimal due to an excessive volume of blood received in culture bottles Performed at [facility] [address] [place], [place] [address]    Culture   Final    NO GROWTH 3 DAYS Performed at [facility], [place] [address], [place], [place] [address]    Report Status PENDING  Incomplete  Culture, blood (Routine x 2)     Status: None (Preliminary result)   Collection Time: 08/07/19 11:30 PM   Specimen: BLOOD LEFT WRIST  Result Value Ref Range Status   Specimen Description   Final    BLOOD LEFT WRIST Performed at [facility] [address], [place], [place] [address]    Special Requests   Final    BOTTLES DRAWN AEROBIC AND  ANAEROBIC Blood Culture adequate volume Performed at [facility] [address] [place], [place] [address]    Culture   Final    NO GROWTH 3 DAYS Performed at [facility], [place] [address], [place], [place] [address]    Report Status PENDING  Incomplete  Urine culture     Status: Abnormal   Collection Time: 08/08/19  5:36 AM   Specimen: Urine, Clean Catch  Result Value Ref Range Status   Specimen Description   Final    URINE, CLEAN CATCH Performed at [facility], [place] [address] [place], [place] [address]    Special Requests   Final    NONE Performed at [facility], [place] [address], [place], [place] [address]    Culture MULTIPLE SPECIES PRESENT, SUGGEST RECOLLECTION (A)  Final   Report Status 08/09/2019 FINAL  Final  MRSA PCR Screening     Status: None   Collection Time: 08/08/19  1:39 PM   Specimen: Nasal Mucosa; Nasopharyngeal  Result Value Ref Range Status   MRSA by PCR NEGATIVE NEGATIVE Final    Comment:        The GeneXpert MRSA Assay (FDA approved for NASAL specimens only), is one component of a comprehensive MRSA colonization surveillance program. It is not intended to diagnose MRSA infection nor to guide or monitor treatment for MRSA infections. Performed at [facility], [place] [address] [place],  [address]       Radiology Studies: No results found.    LOS: 3 days   Time spent: More than 50% of that time was spent in counseling and/or coordination of care.  Antonieta Pert, MD [facility]  08/11/2019, 3:03 PM

## 2019-08-11 NOTE — TOC Progression Note (Signed)
Transition of Care [facility]) - Progression Note    Patient Details  Name: Leslie Roach MRN: [id number] Date of Birth: [birth year]  Transition of Care [facility]) CM/SW Contact  Servando Snare, [place] Phone Number: 08/11/2019, 1:24 PM  Clinical Narrative:    LCSW notified by floor RN that patient is ready to return to facility. Per facility patient must be sitter free for 24 hours, including tele-sitter. Will re-evaluate in 24 hours.         Expected Discharge Plan and Services                                                 Social Determinants of Health (SDOH) Interventions    Readmission Risk Interventions No flowsheet data found.

## 2019-08-12 LAB — CBC WITH DIFFERENTIAL/PLATELET
Abs Immature Granulocytes: 0.04 10*3/uL (ref 0.00–0.07)
Basophils Absolute: 0 10*3/uL (ref 0.0–0.1)
Basophils Relative: 0 %
Eosinophils Absolute: 0 10*3/uL (ref 0.0–0.5)
Eosinophils Relative: 0 %
HCT: 42.9 % (ref 36.0–46.0)
Hemoglobin: 13.4 g/dL (ref 12.0–15.0)
Immature Granulocytes: 1 %
Lymphocytes Relative: 20 %
Lymphs Abs: 1.3 10*3/uL (ref 0.7–4.0)
MCH: 27.8 pg (ref 26.0–34.0)
MCHC: 31.2 g/dL (ref 30.0–36.0)
MCV: 89 fL (ref 80.0–100.0)
Monocytes Absolute: 0.9 10*3/uL (ref 0.1–1.0)
Monocytes Relative: 14 %
Neutro Abs: 4.2 10*3/uL (ref 1.7–7.7)
Neutrophils Relative %: 65 %
Platelets: 420 10*3/uL — ABNORMAL HIGH (ref 150–400)
RBC: 4.82 MIL/uL (ref 3.87–5.11)
RDW: 13.6 % (ref 11.5–15.5)
WBC: 6.4 10*3/uL (ref 4.0–10.5)
nRBC: 0 % (ref 0.0–0.2)

## 2019-08-12 LAB — COMPREHENSIVE METABOLIC PANEL
ALT: 19 U/L (ref 0–44)
AST: 28 U/L (ref 15–41)
Albumin: 3.2 g/dL — ABNORMAL LOW (ref 3.5–5.0)
Alkaline Phosphatase: 74 U/L (ref 38–126)
Anion gap: 11 (ref 5–15)
BUN: 27 mg/dL — ABNORMAL HIGH (ref 8–23)
CO2: 29 mmol/L (ref 22–32)
Calcium: 8.8 mg/dL — ABNORMAL LOW (ref 8.9–10.3)
Chloride: 98 mmol/L (ref 98–111)
Creatinine, Ser: 0.66 mg/dL (ref 0.44–1.00)
GFR calc Af Amer: >60 mL/min (ref >60)
GFR calc non Af Amer: >60 mL/min (ref >60)
Glucose, Bld: 93 mg/dL (ref 70–99)
Potassium: 4.1 mmol/L (ref 3.5–5.1)
Sodium: 138 mmol/L (ref 135–145)
Total Bilirubin: 0.2 mg/dL — ABNORMAL LOW (ref 0.3–1.2)
Total Protein: 5.4 g/dL — ABNORMAL LOW (ref 6.5–8.1)

## 2019-08-12 LAB — FERRITIN: Ferritin: 39 ng/mL (ref 11–307)

## 2019-08-12 LAB — D-DIMER, QUANTITATIVE: D-Dimer, Quant: 0.64 ug/mL-FEU — ABNORMAL HIGH (ref 0.00–0.50)

## 2019-08-12 LAB — C-REACTIVE PROTEIN: CRP: 0.5 mg/dL (ref <1.0)

## 2019-08-12 MED ORDER — ZINC SULFATE 220 (50 ZN) MG PO CAPS: 220.0000 mg | ORAL_CAPSULE | Freq: Every day | ORAL | 0 refills | Status: AC

## 2019-08-12 MED ORDER — IPRATROPIUM-ALBUTEROL 20-100 MCG/ACT IN AERS
1.0000 | INHALATION_SPRAY | Freq: Two times a day (BID) | RESPIRATORY_TRACT | Status: DC
  Administered 2019-08-13: 1 via RESPIRATORY_TRACT
  Filled 2019-08-12: qty 4

## 2019-08-12 MED ORDER — ASCORBIC ACID 500 MG PO TABS: 500.0000 mg | ORAL_TABLET | Freq: Every day | ORAL | 0 refills | Status: AC

## 2019-08-12 MED ORDER — DEXAMETHASONE 6 MG PO TABS: 6.0000 mg | ORAL_TABLET | ORAL | 0 refills | Status: AC

## 2019-08-12 NOTE — TOC Progression Note (Signed)
Transition of Care [facility]) - Progression Note    Patient Details  Name: Leslie Roach MRN: [id number] Date of Birth: 09-24-1940  Transition of Care [facility]) CM/SW Contact  Purcell Mouton, RN Phone Number: 08/12/2019, 2:11 PM  Clinical Narrative:    Spoke with [facility] Admission Director Huston Foley [phone] concerning pt's return back to facility. Progress notes from Physical and RN with vital signs scanned to [email or web address] at her request.      Barriers to Discharge: Requiring sitter/restraints  Expected Discharge Plan and Services                                                 Social Determinants of Health (SDOH) Interventions    Readmission Risk Interventions No flowsheet data found.

## 2019-08-12 NOTE — Progress Notes (Signed)
Physical Therapy Treatment Patient Details Name: Leslie Roach MRN: [id number] DOB: Feb 02, 1941 Today's Date: 08/12/2019    History of Present Illness 78 year old female with history of dementia/memory loss, hypothyroidism, low back pain/left knee pain, glaucoma sent from skilled nursing facility due to progressive shortness of breath fever chills.  Patient is a long-term resident at [facility] due to [place] outbreak with several residents with testing positive for Covid.  EMS was called, O2 was 90% on room air was placed on 4 L and transported to the ER where she was diagnosed with COVID.    PT Comments    Pt continues to participate well. She required some increased assistance on today for standing. Recommend return to facility as long as staff can provide current level of assist.    Follow Up Recommendations  Home health PT;Supervision for mobility/OOB(at facility)     Equipment Recommendations  None recommended by PT    Recommendations for Other Services       Precautions / Restrictions Precautions Precautions: Fall Precaution Comments: airborne (covid) Restrictions Weight Bearing Restrictions: No    Mobility  Bed Mobility Overal bed mobility: Needs Assistance Bed Mobility: Supine to Sit     Supine to sit: Min assist     General bed mobility comments: MIN A for trunk to get fully upright.  MIN A to scoot to EOB.  Transfers Overall transfer level: Needs assistance Equipment used: Rolling walker (2 wheeled) Transfers: Sit to/from [facility] Sit to Stand: Mod assist Stand pivot transfers: Min assist       General transfer comment: Assist to rise, stabilize, control descent. VCs safety, technique, hand placement.  Ambulation/Gait Ambulation/Gait assistance: Min assist Gait Distance (Feet): 30 Feet Assistive device: Rolling walker (2 wheeled) Gait Pattern/deviations: Step-through pattern;Trunk flexed;Decreased stride length      General Gait Details: Assist to stabilize throughout distance. Cues for safety.   Stairs             Wheelchair Mobility    Modified Rankin (Stroke Patients Only)       Balance Overall balance assessment: Needs assistance         Standing balance support: Bilateral upper extremity supported Standing balance-Leahy Scale: Poor                              Cognition Arousal/Alertness: Awake/alert Behavior During Therapy: WFL for tasks assessed/performed Overall Cognitive Status: History of cognitive impairments - at baseline                                        Exercises      General Comments        Pertinent Vitals/Pain Pain Assessment: No/denies pain    Home Living                      Prior Function            PT Goals (current goals can now be found in the care plan section) Progress towards PT goals: Progressing toward goals    Frequency    Min 3X/week      PT Plan Current plan remains appropriate    Co-evaluation              AM-PAC PT "6 Clicks" Mobility   Outcome Measure  Help needed turning from your back  to your side while in a flat bed without using bedrails?: A Little Help needed moving from lying on your back to sitting on the side of a flat bed without using bedrails?: A Little Help needed moving to and from a bed to a chair (including a wheelchair)?: A Little Help needed standing up from a chair using your arms (e.g., wheelchair or bedside chair)?: A Little Help needed to walk in hospital room?: A Little Help needed climbing 3-5 steps with a railing? : A Lot 6 Click Score: 17    End of Session Equipment Utilized During Treatment: Gait belt Activity Tolerance: Patient tolerated treatment well Patient left: in bed;with call bell/phone within reach;with bed alarm set   PT Visit Diagnosis: Muscle weakness (generalized) (M62.81);Unsteadiness on feet (R26.81)     Time:  [id number] PT Time Calculation (min) (ACUTE ONLY): 22 min  Charges:  $Gait Training: 8-22 mins                      Weston Anna, PT Acute Rehabilitation Services Pager: [phone] Office: [phone]

## 2019-08-12 NOTE — Discharge Summary (Addendum)
Physician Discharge Summary  Leslie Roach [id number] DOB: 1941-01-25 DOA: 08/07/2019  PCP: [name], MD  Admit date: 08/07/2019 Discharge date: 08/12/2019  Admitted From: [place] Disposition:  [facility] ALF/memory care  Recommendations for Outpatient Follow-up:  1. Follow up with PCP in 1-2 weeks 2. Please obtain BMP/CBC in one week 3. Please follow up on the following pending results:  Home Health:no  Equipment/Devices:non  Discharge Condition: Stable CODE STATUS: FULL Diet recommendation: Heart Healthy,  Brief/Interim Summary: 78 year old female with history of dementia/memory loss, hypothyroidism, low back pain/left knee pain, glaucoma sent from skilled nursing facility due to progressive shortness of breath fever chills.Patient is a long-term resident at [facility] due to [place] outbreak with several residents with testing positive for Covid.EMS was called, O2 was 90% on room air was placed on 4 L and transported to the ER. In the ER,chest x-ray with bibasilar infiltrates, positive for COVID-19 saturations were in upper 80s on room air,initially tachypneic in 24/min, hr 74/min.Labs showed CRP 2.4 procalcitonin less than 0.1 D-dimer 1.0,ferritin normal at 35. Patientwas admitted and started on Decadron remdesivir supplemental oxygen Patient admitted and treated with steroid and remdesivir.  This time she is clinically improved she has baseline dementia alert awake oriented to self, she is on room air saturating well. Labs are stable inflammatory markers improved CRP normal 0.5.  08/13/19: updated D/C summary On RA, completed iv remdesivir and will need 4 more days of po decadron and stable for return to ALF/memory care.  Subjective: No acute events overnight. She is alert awake oriented x1, with baseline dementia, no sitter.    Assessment & Plan:  [id number] pneumonia/Acutepulmonaryinsufficiency:Chest x-ray "Low volumes with bibasilar  atelectasis".She was treatedivDecadron,remdesivir pharmacy to dose. At this time she is medically stable for discharge and return back to skilled nursing facility/memory care continue supportive care. Her Inflammatory markers as below. Recent Labs    08/10/19 0330 08/11/19 0440 08/12/19 0254  DDIMER 0.96* 0.87* 0.64*  FERRITIN 47 49 39  CRP 1.3* 0.8 0.5  Alzheimer's dementia/memory loss:Remains at baseline.Continue supportive care, will precaution, re-orientation,continue home Celexa Aricept Namenda. Hypothyroidism: continue Synthroid. [id number] statin. Hypokalemia-improved.  CODE STATUS:DNR. Family communication: I updatedpatient's son Dr. Lin Landsman and agreeable for return to ALF Disposition:Return to ALF/memory care once arrangement made  Consultants:None Procedures:None Microbiology:None  Discharge Diagnoses:  Principal Problem:   Acute hypoxemic respiratory failure due to severe acute respiratory syndrome coronavirus 2 (SARS-CoV-2) disease ([place]) Active Problems:   Memory loss   Alzheimer disease ([place])   Pneumonia due to COVID-19 virus Discharge Instructions.  Allergies as of 08/12/2019   No Known Allergies     Medication List    TAKE these medications   acetaminophen 500 MG tablet Commonly known as: TYLENOL Take 1,000 mg by mouth every 6 (six) hours as needed for mild pain.   alendronate 10 MG tablet Commonly known as: FOSAMAX Take 10 mg by mouth daily before breakfast.   ascorbic acid 500 MG tablet Commonly known as: VITAMIN C Take 1 tablet (500 mg total) by mouth daily for 7 days. Start taking on: August 13, 2019   aspirin 325 MG tablet Take 325 mg by mouth daily.   cholecalciferol 1000 units tablet Commonly known as: VITAMIN D Take 1,000 Units by mouth daily.   citalopram 10 MG tablet Commonly known as: CELEXA Take 10 mg by mouth daily.   dexamethasone 6 MG tablet Commonly known as: DECADRON Take 1 tablet (6 mg total) by mouth daily for 4  days.  Start taking on: August 13, 2019   donepezil 10 MG tablet Commonly known as: ARICEPT TAKE 1/2 TABLET BY MOUTH EVERY DAY and TAKE 1/2 TABLET BY MOUTH EVERY EVENING What changed:   how much to take  how to take this  when to take this  additional instructions   dorzolamide 2 % ophthalmic solution Commonly known as: TRUSOPT Place 1 drop into both eyes 2 (two) times daily.   latanoprost 0.005 % ophthalmic solution Commonly known as: XALATAN Place 1 drop into both eyes at bedtime.   levothyroxine 25 MCG tablet Commonly known as: SYNTHROID Take 25 mcg by mouth daily before breakfast.   memantine 10 MG tablet Commonly known as: NAMENDA TAKE ONE TABLET BY MOUTH TWICE DAILY   pravastatin 20 MG tablet Commonly known as: PRAVACHOL Take 1 tablet (20 mg total) by mouth every evening.   zinc sulfate 220 (50 Zn) MG capsule Take 1 capsule (220 mg total) by mouth daily for 7 days. Start taking on: August 13, 2019       No Known Allergies Consultations:  None Procedures/Studies: Dg Chest Port 1 View  Result Date: 08/07/2019 CLINICAL DATA:  Suspected sepsis EXAM: PORTABLE CHEST 1 VIEW COMPARISON:  10/09/2018 FINDINGS: Bibasilar atelectasis. Low lung volumes. Heart is normal size. No effusions or pneumothorax. No acute bony abnormality. IMPRESSION: Low volumes with bibasilar atelectasis. Electronically Signed   By: Rolm Baptise M.D.   On: 08/07/2019 23:11   Discharge Exam: Vitals:   08/12/19 0646 08/12/19 1147  BP: 132/83 122/76  Pulse: 60 (!) 48  Resp:  (!) 22  Temp:  98.8 F (37.1 C)  SpO2:  94%   Vitals:   08/11/19 2023 08/12/19 0605 08/12/19 0646 08/12/19 1147  BP: 128/72 (!) 78/61 132/83 122/76  Pulse:  68 60 (!) 48  Resp:    (!) 22  Temp: 98.1 F (36.7 C) 97.8 F (36.6 C)  98.8 F (37.1 C)  TempSrc: Oral Oral  Oral  SpO2:  96%  94%  Weight:      Height:       General: Pt is alert, awake, oriented x1, not in acute distress Cardiovascular: RRR,  S1/S2 +, no rubs, no gallops Respiratory: CTA bilaterally, no wheezing, no rhonchi Abdominal: Soft, NT, ND, bowel sounds + Extremities: no edema, no cyanosis  The results of significant diagnostics from this hospitalization (including imaging, microbiology, ancillary and laboratory) are listed below for reference.     Microbiology: Recent Results (from the past 240 hour(s))  Culture, blood (Routine x 2)     Status: None (Preliminary result)   Collection Time: 08/07/19 11:30 PM   Specimen: BLOOD  Result Value Ref Range Status   Specimen Description   Final    BLOOD LEFT ANTECUBITAL Performed at [facility] [address], [place], [place] [address]    Special Requests   Final    BOTTLES DRAWN AEROBIC AND ANAEROBIC Blood Culture results may not be optimal due to an excessive volume of blood received in culture bottles Performed at [facility] [place] [address], [place], [place] [address]    Culture   Final    NO GROWTH 3 DAYS Performed at [facility], [place] [address] [place], [place] [address]    Report Status PENDING  Incomplete  Culture, blood (Routine x 2)     Status: None (Preliminary result)   Collection Time: 08/07/19 11:30 PM   Specimen: BLOOD LEFT WRIST  Result Value Ref Range  Status   Specimen Description   Final    BLOOD LEFT WRIST Performed at [facility], [place] [address], [place], [place] [address]    Special Requests   Final    BOTTLES DRAWN AEROBIC AND ANAEROBIC Blood Culture adequate volume Performed at [facility] [address], [place], [place] [address]    Culture   Final    NO GROWTH 3 DAYS Performed at [facility], [place] [address] [place], [place] [address]    Report Status PENDING  Incomplete  Urine culture     Status: Abnormal   Collection Time: 08/08/19  5:36 AM   Specimen: Urine, Clean Catch  Result Value Ref Range Status   Specimen Description   Final    URINE,  CLEAN CATCH Performed at [facility], [place] [address] [place], [place] [address]    Special Requests   Final    NONE Performed at [facility], [place] [address] [place], [place] [address]    Culture MULTIPLE SPECIES PRESENT, SUGGEST RECOLLECTION (A)  Final   Report Status 08/09/2019 FINAL  Final  MRSA PCR Screening     Status: None   Collection Time: 08/08/19  1:39 PM   Specimen: Nasal Mucosa; Nasopharyngeal  Result Value Ref Range Status   MRSA by PCR NEGATIVE NEGATIVE Final    Comment:        The GeneXpert MRSA Assay (FDA approved for NASAL specimens only), is one component of a comprehensive MRSA colonization surveillance program. It is not intended to diagnose MRSA infection nor to guide or monitor treatment for MRSA infections. Performed at [facility], [place] [address] [place],  [address]      Labs: BNP (last 3 results) Recent Labs    08/07/19 2250  BNP [id number]   Basic Metabolic Panel: Recent Labs  Lab 08/07/19 2250 08/09/19 0412 08/10/19 0330 08/11/19 0440 08/12/19 0631  NA 137 139 139 140 138  K 3.4* 3.2* 3.4* 3.5 4.1  CL 98 99 101 99 98  CO2 28 26 29 30 29   GLUCOSE 122* 90 93 93 93  BUN 14 14 23 19  27*  CREATININE 0.64 0.72 0.59 0.61 0.66  CALCIUM 9.1 8.5* 8.5* 8.7* 8.8*   Liver Function Tests: Recent Labs  Lab 08/07/19 2250 08/09/19 0412 08/10/19 0330 08/11/19 0440 08/12/19 0631  AST 31 32 27 26 28   ALT 21 22 19 18 19   ALKPHOS 96 84 75 77 74  BILITOT 0.3 0.8 0.4 0.4 0.2*  PROT 6.5 5.8* 5.4* 6.0* 5.4*  ALBUMIN 3.6 3.2* 2.9* 3.4* 3.2*   No results for input(s): LIPASE, AMYLASE in the last 168 hours. No results for input(s): AMMONIA in the last 168 hours. CBC: Recent Labs  Lab 08/07/19 2250 08/09/19 0412 08/10/19 0330 08/11/19 0440 08/12/19 0254  WBC 4.6 4.1 4.0 4.9 6.4  NEUTROABS 3.3 2.1 2.3 2.9 4.2  HGB 13.3 13.7 12.1 13.3 13.4  HCT 42.4 44.9 39.4 42.6 42.9  MCV 90.0  92.0 90.6 90.4 89.0  PLT 300 303 326 374 420*   Cardiac Enzymes: No results for input(s): CKTOTAL, CKMB, CKMBINDEX, TROPONINI in the last 168 hours. BNP: Invalid input(s): POCBNP CBG: Recent Labs  Lab 08/08/19 1937  GLUCAP 111*   D-Dimer Recent Labs    08/11/19 0440 08/12/19 0254  DDIMER 0.87* 0.64*   Hgb A1c No results for input(s): HGBA1C in the last 72 hours. Lipid Profile No results for input(s): CHOL,  HDL, LDLCALC, TRIG, CHOLHDL, LDLDIRECT in the last 72 hours. Thyroid function studies No results for input(s): TSH, T4TOTAL, T3FREE, THYROIDAB in the last 72 hours.  Invalid input(s): FREET3 Anemia work up Recent Labs    08/11/19 0440 08/12/19 0254  FERRITIN 49 39   Urinalysis    Component Value Date/Time   COLORURINE YELLOW 08/08/2019 0536   APPEARANCEUR HAZY (A) 08/08/2019 0536   LABSPEC 1.011 08/08/2019 0536   PHURINE 8.0 08/08/2019 0536   GLUCOSEU NEGATIVE 08/08/2019 0536   HGBUR SMALL (A) 08/08/2019 0536   BILIRUBINUR NEGATIVE 08/08/2019 0536   KETONESUR NEGATIVE 08/08/2019 0536   PROTEINUR NEGATIVE 08/08/2019 0536   NITRITE NEGATIVE 08/08/2019 0536   LEUKOCYTESUR TRACE (A) 08/08/2019 0536   Sepsis Labs Invalid input(s): PROCALCITONIN,  WBC,  LACTICIDVEN Microbiology Recent Results (from the past 240 hour(s))  Culture, blood (Routine x 2)     Status: None (Preliminary result)   Collection Time: 08/07/19 11:30 PM   Specimen: BLOOD  Result Value Ref Range Status   Specimen Description   Final    BLOOD LEFT ANTECUBITAL Performed at [facility], [place] [address], [place], [place] [address]    Special Requests   Final    BOTTLES DRAWN AEROBIC AND ANAEROBIC Blood Culture results may not be optimal due to an excessive volume of blood received in culture bottles Performed at [place] [address] [place], [place] [address]    Culture   Final    NO GROWTH 3 DAYS Performed at [facility], [place]  [address] [place], [place] [address]    Report Status PENDING  Incomplete  Culture, blood (Routine x 2)     Status: None (Preliminary result)   Collection Time: 08/07/19 11:30 PM   Specimen: BLOOD LEFT WRIST  Result Value Ref Range Status   Specimen Description   Final    BLOOD LEFT WRIST Performed at [place] [address] [place], [place] [address]    Special Requests   Final    BOTTLES DRAWN AEROBIC AND ANAEROBIC Blood Culture adequate volume Performed at [place] [address] [place], [place] [address]    Culture   Final    NO GROWTH 3 DAYS Performed at [facility], [place] [address], [place], [place] [address]    Report Status PENDING  Incomplete  Urine culture     Status: Abnormal   Collection Time: 08/08/19  5:36 AM   Specimen: Urine, Clean Catch  Result Value Ref Range Status   Specimen Description   Final    URINE, CLEAN CATCH Performed at [facility], [place] [address], [place], [place] [address]    Special Requests   Final    NONE Performed at [facility], [place] [address] [place],  [address]    Culture MULTIPLE SPECIES PRESENT, SUGGEST RECOLLECTION (A)  Final   Report Status 08/09/2019 FINAL  Final  MRSA PCR Screening     Status: None   Collection Time: 08/08/19  1:39 PM   Specimen: Nasal Mucosa; Nasopharyngeal  Result Value Ref Range Status   MRSA by PCR NEGATIVE NEGATIVE Final    Comment:        The GeneXpert MRSA Assay (FDA approved for NASAL specimens only), is one component of a comprehensive MRSA colonization surveillance program. It is not intended to diagnose MRSA infection nor to guide or monitor treatment for MRSA infections. Performed at [facility]), [place] [address] [place], [place] [address]      Time coordinating discharge: 35 minutes  SIGNED:   Antonieta Pert, MD  [facility] 08/12/2019, 3:35 PM  If 7PM-7AM, please contact  night-coverage [email or web address]

## 2019-08-13 LAB — COMPREHENSIVE METABOLIC PANEL
ALT: 20 U/L (ref 0–44)
AST: 32 U/L (ref 15–41)
Albumin: 3.1 g/dL — ABNORMAL LOW (ref 3.5–5.0)
Alkaline Phosphatase: 75 U/L (ref 38–126)
Anion gap: 12 (ref 5–15)
BUN: 26 mg/dL — ABNORMAL HIGH (ref 8–23)
CO2: 26 mmol/L (ref 22–32)
Calcium: 8.6 mg/dL — ABNORMAL LOW (ref 8.9–10.3)
Chloride: 101 mmol/L (ref 98–111)
Creatinine, Ser: 0.57 mg/dL (ref 0.44–1.00)
GFR calc Af Amer: >60 mL/min (ref >60)
GFR calc non Af Amer: >60 mL/min (ref >60)
Glucose, Bld: 87 mg/dL (ref 70–99)
Potassium: 3.5 mmol/L (ref 3.5–5.1)
Sodium: 139 mmol/L (ref 135–145)
Total Bilirubin: 0.2 mg/dL — ABNORMAL LOW (ref 0.3–1.2)
Total Protein: 5.7 g/dL — ABNORMAL LOW (ref 6.5–8.1)

## 2019-08-13 LAB — CBC WITH DIFFERENTIAL/PLATELET
Abs Immature Granulocytes: 0.03 10*3/uL (ref 0.00–0.07)
Basophils Absolute: 0 10*3/uL (ref 0.0–0.1)
Basophils Relative: 0 %
Eosinophils Absolute: 0 10*3/uL (ref 0.0–0.5)
Eosinophils Relative: 0 %
HCT: 43.3 % (ref 36.0–46.0)
Hemoglobin: 13.5 g/dL (ref 12.0–15.0)
Immature Granulocytes: 0 %
Lymphocytes Relative: 19 %
Lymphs Abs: 1.3 10*3/uL (ref 0.7–4.0)
MCH: 28.1 pg (ref 26.0–34.0)
MCHC: 31.2 g/dL (ref 30.0–36.0)
MCV: 90.2 fL (ref 80.0–100.0)
Monocytes Absolute: 0.8 10*3/uL (ref 0.1–1.0)
Monocytes Relative: 12 %
Neutro Abs: 4.7 10*3/uL (ref 1.7–7.7)
Neutrophils Relative %: 69 %
Platelets: 490 10*3/uL — ABNORMAL HIGH (ref 150–400)
RBC: 4.8 MIL/uL (ref 3.87–5.11)
RDW: 13.7 % (ref 11.5–15.5)
WBC: 6.9 10*3/uL (ref 4.0–10.5)
nRBC: 0 % (ref 0.0–0.2)

## 2019-08-13 LAB — D-DIMER, QUANTITATIVE: D-Dimer, Quant: 0.62 ug/mL-FEU — ABNORMAL HIGH (ref 0.00–0.50)

## 2019-08-13 LAB — CULTURE, BLOOD (ROUTINE X 2)
Culture: NO GROWTH
Culture: NO GROWTH
Special Requests: ADEQUATE

## 2019-08-13 LAB — C-REACTIVE PROTEIN: CRP: 0.6 mg/dL (ref <1.0)

## 2019-08-13 LAB — FERRITIN: Ferritin: 33 ng/mL (ref 11–307)

## 2019-08-13 MED ORDER — IPRATROPIUM-ALBUTEROL 20-100 MCG/ACT IN AERS: 1.0000 | INHALATION_SPRAY | Freq: Two times a day (BID) | RESPIRATORY_TRACT | Status: AC

## 2019-08-13 NOTE — Care Management Important Message (Signed)
Important Message  Patient Details IM Letter given to Gabriel Earing RN Case Manager to present to the Patient Name: Leslie Roach MRN: [id number] Date of Birth: Mar 14, 1941   Medicare Important Message Given:  Yes     Kerin Salen 08/13/2019, 10:28 AM

## 2019-08-13 NOTE — TOC Progression Note (Signed)
Transition of Care [facility]) - Progression Note    Patient Details  Name: Apple Benedicto MRN: [id number] Date of Birth: 1940/11/22  [facility]) CM/SW Contact  Purcell Mouton, RN Phone Number: 08/13/2019, 2:13 PM  Clinical Narrative:     Discharge summary faxed to [facility] ALF/Memory Care x3.    Barriers to Discharge: Requiring sitter/restraints  Expected Discharge Plan and Services                                                 Social Determinants of Health (SDOH) Interventions    Readmission Risk Interventions No flowsheet data found.

## 2019-08-13 NOTE — TOC Progression Note (Signed)
Transition of Care [facility]) - Progression Note    Patient Details  Name: Leslie Roach MRN: [id number] Date of Birth: 1940/09/17  Transition of Care [facility]) CM/SW Contact  Purcell Mouton, RN Phone Number: 08/13/2019, 3:29 PM  Clinical Narrative:    Waiting for [facility] ALF to return call. Discharge summary was faxed/scanned email.      Barriers to Discharge: Requiring sitter/restraints  Expected Discharge Plan and Services                                                 Social Determinants of Health (SDOH) Interventions    Readmission Risk Interventions No flowsheet data found.

## 2019-08-13 NOTE — TOC Progression Note (Signed)
Transition of Care [facility]) - Progression Note    Patient Details  Name: Leslie Roach MRN: [id number] Date of Birth: April 07, 1941  Transition of Care [facility]) CM/SW Contact  Purcell Mouton, RN Phone Number: 08/13/2019, 3:56 PM  Clinical Narrative:    Spoke with pt's son Dr. Lin Landsman concerning PT, OT, ALF is not allowing PT/OT to come into the facility. Dr. Lin Landsman agreed with waving PT/OT and called [facility]. A call was made to [facility], RN to conform. Pt may go back to ALF. [facility] was called.      Barriers to Discharge: Requiring sitter/restraints  Expected Discharge Plan and Services                                                 Social Determinants of Health (SDOH) Interventions    Readmission Risk Interventions No flowsheet data found.

## 2019-08-13 NOTE — Progress Notes (Signed)
PROGRESS NOTE    Marikate Aguirre  [id number] DOB: 1940-12-13 DOA: 08/07/2019 PCP: [name], MD   Brief Narrative: 78 year old female with history of dementia/memory loss, hypothyroidism, low back pain/left knee pain, glaucoma sent from skilled nursing facility due to progressive shortness of breath fever chills.  Patient is a long-term resident at [facility] due to [place] outbreak with several residents with testing positive for Covid.  EMS was called, O2 was 90% on room air was placed on 4 L and transported to the ER. In the ER, chest x-ray with bibasilar infiltrates, positive for COVID-19 saturations were in upper 80s on room air, initially tachypneic in 24/min, hr 74/min.  Labs showed CRP 2.4 procalcitonin less than 0.1 D-dimer 1.0, ferritin normal at 35. Patient was admitted and started on Decadron remdesivir supplemental oxygen Patientwas admitted and started on Decadron remdesivir supplemental oxygen Patient admitted and treated with steroid and remdesivir.  This time she is clinically improved she has baseline dementia alert awake oriented to self, she is on room air saturating well. Labs are stable inflammatory markers improved CRP normal 0.5.  08/13/19: updated D/C summary On RA, completed iv remdesivir and will need 4 more days of po decadron and stable for return to ALF/memory care.  Subjective: No acute events overnight. Alert awake oriented x1. On room air doing well.  Assessment & Plan COVID-19 pneumonia/Acutepulmonaryinsufficiency:Chest x-ray "Low volumes with bibasilar atelectasis".She was treatedivDecadron,remdesivir pharmacy to dose. At this time she is medically stable for discharge and return back to ALF/memory care continue supportive care. Her Inflammatory markers as below.  Continue Decadron to complete the course. COVID-19 Labs Recent Labs    08/11/19 0440 08/12/19 0254 08/13/19 0315  DDIMER 0.87* 0.64* 0.62*  FERRITIN 49 39 33  CRP  0.8 0.5 0.6    No results found for: SARSCOV2NAA  Alzheimer's dementia/memory loss:Remains at baseline oriented x1,.Continue supportive care, will precaution, re-orientation,continue home Celexa Aricept Namenda. Hypothyroidism: continue Synthroid. [id number] statin. Hypokalemia-improved.  CODE STATUS:DNR. Family communication: I had updatedpatient's son Dr. Lin Landsman and agreeable for return to ALF. Cm discussed w son today. Disposition:Return to ALF/memory care once arrangement made  CODE STATUS:DNR. Family communication:  Son was updated regarding plan of care, discussed with case Freight forwarder.   Disposition: Return to assisted living facility/memory care today.    Consultants: none Procedures: none Microbiology: none  Antimicrobials: Anti-infectives (From admission, onward)   Start     Dose/Rate Route Frequency Ordered Stop   08/10/19 1000  remdesivir 100 mg in sodium chloride 0.9 % 100 mL IVPB     100 mg 200 mL/hr over 30 Minutes Intravenous Daily 08/10/19 0659 08/12/19 1140   08/09/19 1400  remdesivir 100 mg in sodium chloride 0.9 % 250 mL IVPB  Status:  Discontinued     100 mg 500 mL/hr over 30 Minutes Intravenous Every 24 hours 08/08/19 0158 08/10/19 0659   08/08/19 0200  remdesivir 200 mg in sodium chloride 0.9 % 250 mL IVPB     200 mg 500 mL/hr over 30 Minutes Intravenous Once 08/08/19 0158 08/08/19 0333       Objective: Vitals:   08/12/19 2020 08/12/19 2328 08/13/19 0530 08/13/19 1157  BP: (!) 146/93  (!) 153/82 129/83  Pulse: (!) 109 (!) 57 (!) 59 68  Resp: 18 16 17 19   Temp: 97.8 F (36.6 C)  98.4 F (36.9 C) 98.4 F (36.9 C)  TempSrc: Oral  Oral Oral  SpO2: 97% 95% 94% 93%  Weight:  Height:        Intake/Output Summary (Last 24 hours) at 08/13/2019 1600 Last data filed at 08/13/2019 0600 Gross per 24 hour  Intake 300 ml  Output 600 ml  Net -300 ml   Filed Weights   08/07/19 2244 08/08/19 1322  Weight: 73 kg 83.1 kg   Weight change:   Body  mass index is 31.45 kg/m.  Intake/Output from previous day: 12/07 0701 - 12/08 0700 In: 660 [P.O.:660] Out: 600 [Urine:600] Intake/Output this shift: No intake/output data recorded.  Examination:  General exam:  AOX1, nad, on ra HEENT:Oral mucosa moist, Ear/Nose WNL grossly, dentition normal. Respiratory system: b/l clear, non tender and no use of accessory muscle, no wheezing  Cardiovascular system: S1 & S2 +, No JVD,. Gastrointestinal system: Abdomen soft, NT,ND, BS+ Nervous System:Alert, awake,non focal Extremities: No edema, distal peripheral pulses palpable.  Skin: No rashes,no icterus. MSK: Normal muscle bulk,tone, power  Medications:  Scheduled Meds: . aspirin  325 mg Oral Daily  . citalopram  10 mg Oral Daily  . dexamethasone  6 mg Oral Q24H  . docusate sodium  100 mg Oral Daily  . donepezil  10 mg Oral QHS  . dorzolamide  1 drop Both Eyes BID  . enoxaparin (LOVENOX) injection  40 mg Subcutaneous Q24H  . Ipratropium-Albuterol  1 puff Inhalation BID  . latanoprost  1 drop Both Eyes QHS  . levothyroxine  25 mcg Oral Q0600  . memantine  10 mg Oral BID  . pravastatin  20 mg Oral QPM  . vitamin C  500 mg Oral Daily  . zinc sulfate  220 mg Oral Daily   Continuous Infusions:   Data Reviewed: I have personally reviewed following labs and imaging studies  CBC: Recent Labs  Lab 08/09/19 0412 08/10/19 0330 08/11/19 0440 08/12/19 0254 08/13/19 0315  WBC 4.1 4.0 4.9 6.4 6.9  NEUTROABS 2.1 2.3 2.9 4.2 4.7  HGB 13.7 12.1 13.3 13.4 13.5  HCT 44.9 39.4 42.6 42.9 43.3  MCV 92.0 90.6 90.4 89.0 90.2  PLT 303 326 374 420* [id number]*   Basic Metabolic Panel: Recent Labs  Lab 08/09/19 0412 08/10/19 0330 08/11/19 0440 08/12/19 0631 08/13/19 0315  NA 139 139 140 138 139  K 3.2* 3.4* 3.5 4.1 3.5  CL 99 101 99 98 101  CO2 26 29 30 29 26   GLUCOSE 90 93 93 93 87  BUN 14 23 19  27* 26*  CREATININE 0.72 0.59 0.61 0.66 0.57  CALCIUM 8.5* 8.5* 8.7* 8.8* 8.6*   GFR:  Estimated Creatinine Clearance: 58.5 mL/min (by C-G formula based on SCr of 0.57 mg/dL). Liver Function Tests: Recent Labs  Lab 08/09/19 0412 08/10/19 0330 08/11/19 0440 08/12/19 0631 08/13/19 0315  AST 32 27 26 28  32  ALT 22 19 18 19 20   ALKPHOS 84 75 77 74 75  BILITOT 0.8 0.4 0.4 0.2* 0.2*  PROT 5.8* 5.4* 6.0* 5.4* 5.7*  ALBUMIN 3.2* 2.9* 3.4* 3.2* 3.1*   No results for input(s): LIPASE, AMYLASE in the last 168 hours. No results for input(s): AMMONIA in the last 168 hours. Coagulation Profile: Recent Labs  Lab 08/07/19 2250  INR 0.9   Cardiac Enzymes: No results for input(s): CKTOTAL, CKMB, CKMBINDEX, TROPONINI in the last 168 hours. BNP (last 3 results) No results for input(s): PROBNP in the last 8760 hours. HbA1C: No results for input(s): HGBA1C in the last 72 hours. CBG: Recent Labs  Lab 08/08/19 1937  GLUCAP 111*   Lipid Profile: No results  for input(s): CHOL, HDL, LDLCALC, TRIG, CHOLHDL, LDLDIRECT in the last 72 hours. Thyroid Function Tests: No results for input(s): TSH, T4TOTAL, FREET4, T3FREE, THYROIDAB in the last 72 hours. Anemia Panel: Recent Labs    08/12/19 0254 08/13/19 0315  FERRITIN 39 33   Sepsis Labs: Recent Labs  Lab 08/07/19 2250 08/08/19 0050  PROCALCITON <0.10  --   LATICACIDVEN 0.9 1.1    Recent Results (from the past 240 hour(s))  Culture, blood (Routine x 2)     Status: None   Collection Time: 08/07/19 11:30 PM   Specimen: BLOOD  Result Value Ref Range Status   Specimen Description   Final    BLOOD LEFT ANTECUBITAL Performed at [facility], [place] [address], [place], [place] [address]    Special Requests   Final    BOTTLES DRAWN AEROBIC AND ANAEROBIC Blood Culture results may not be optimal due to an excessive volume of blood received in culture bottles Performed at [place] [address], [place], [place] [address]    Culture   Final    NO GROWTH 5 DAYS Performed at [facility], [place] [address] [place], [place] [address]    Report Status 08/13/2019 FINAL  Final  Culture, blood (Routine x 2)     Status: None   Collection Time: 08/07/19 11:30 PM   Specimen: BLOOD LEFT WRIST  Result Value Ref Range Status   Specimen Description   Final    BLOOD LEFT WRIST Performed at [place] [address], [place], [place] [address]    Special Requests   Final    BOTTLES DRAWN AEROBIC AND ANAEROBIC Blood Culture adequate volume Performed at [place] [address], [place], [place] [address]    Culture   Final    NO GROWTH 5 DAYS Performed at [facility], [place] [address], [place], [place] [address]    Report Status 08/13/2019 FINAL  Final  Urine culture     Status: Abnormal   Collection Time: 08/08/19  5:36 AM   Specimen: Urine, Clean Catch  Result Value Ref Range Status   Specimen Description   Final    URINE, CLEAN CATCH Performed at [facility], [place] [address], [place], [place] [address]    Special Requests   Final    NONE Performed at [facility], [place] [address] [place], [place] [address]    Culture MULTIPLE SPECIES PRESENT, SUGGEST RECOLLECTION (A)  Final   Report Status 08/09/2019 FINAL  Final  MRSA PCR Screening     Status: None   Collection Time: 08/08/19  1:39 PM   Specimen: Nasal Mucosa; Nasopharyngeal  Result Value Ref Range Status   MRSA by PCR NEGATIVE NEGATIVE Final    Comment:        The GeneXpert MRSA Assay (FDA approved for NASAL specimens only), is one component of a comprehensive MRSA colonization surveillance program. It is not intended to diagnose MRSA infection nor to guide or monitor treatment for MRSA infections. Performed at [facility], [place] [address] [place], [place] [address]       Radiology Studies: No results found.    LOS: 5 days   Time spent: More than 50% of that time was spent in counseling and/or  coordination of care.  Antonieta Pert, MD [facility]  08/13/2019, 4:00 PM

## 2019-08-13 NOTE — Progress Notes (Signed)
Attempted to call report to [facility] x3 at 1700, 1705, and 1718 to [phone]. Phone rang without answer.

## 2019-08-23 ENCOUNTER — Emergency Department (HOSPITAL_COMMUNITY)
Admission: EM | Admit: 2019-08-23 | Discharge: 2019-09-06 | Disposition: E | Payer: Medicare Other | Attending: Emergency Medicine | Admitting: Emergency Medicine

## 2019-08-23 DIAGNOSIS — I469 Cardiac arrest, cause unspecified: Secondary | ICD-10-CM

## 2019-08-23 NOTE — ED Triage Notes (Signed)
Pt comes via [facility] from [facility], COVID +, called for decreased mental status, pt was DNR, pt expired enroute to hospital. Time of death 2247-11-29.

## 2019-08-23 NOTE — ED Provider Notes (Signed)
[facility] EMERGENCY DEPARTMENT Provider Note  CSN: [id number] Arrival date & time: 08/07/2019 2318/12/02  Chief Complaint(s) Cardiac Arrest Pt comes via [facility] from [facility], COVID +, called for decreased mental status, pt was DNR, pt expired enroute to hospital. Time of death 12-02-47.   HPI Leslie Roach is a 78 y.o. female presents by EMS from facility after being found minimally responsive with agonal breathing. She was DNR and expired en route.  Remainder of history, ROS, and physical exam limited due to patient's condition (deceased). Additional information was obtained from EMS.   Level V Caveat.    HPI  Past Medical History Past Medical History:  Diagnosis Date  . Eczema   . Glaucoma   . Hypothyroidism   . Left knee pain   . Lower back pain   . Memory loss 04/04/2013  . Migraine    Patient Active Problem List   Diagnosis Date Noted  . Acute hypoxemic respiratory failure due to severe acute respiratory syndrome coronavirus 2 (SARS-CoV-2) disease ([place]) 08/08/2019  . Pneumonia due to COVID-19 virus 08/08/2019  . Alzheimer disease ([place]) 02/19/2018  . 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 Memory loss 04/04/2013   Home Medication(s) Prior to Admission medications   Medication Sig Start Date End Date Taking? Authorizing Provider  acetaminophen (TYLENOL) 500 MG tablet Take 1,000 mg by mouth every 6 (six) hours as needed for mild pain.    [provider]  alendronate (FOSAMAX) 10 MG tablet Take 10 mg by mouth daily before breakfast.  11/08/17   [provider]  aspirin 325 MG tablet Take 325 mg by mouth daily.    [provider]  cholecalciferol (VITAMIN D) 1000 UNITS tablet Take 1,000 Units by mouth daily.    [provider]  citalopram (CELEXA) 10 MG tablet Take 10 mg by mouth daily.    [provider]  donepezil (ARICEPT) 10 MG tablet TAKE 1/2 TABLET BY MOUTH EVERY DAY and  TAKE 1/2 TABLET BY MOUTH EVERY EVENING Patient taking differently: Take 10 mg by mouth at bedtime.  08/06/18   Dohmeier, Asencion [place], MD  dorzolamide (TRUSOPT) 2 % ophthalmic solution Place 1 drop into both eyes 2 (two) times daily.    [provider]  Ipratropium-Albuterol (COMBIVENT) 20-100 MCG/ACT AERS respimat Inhale 1 puff into the lungs 2 (two) times daily. 08/13/19   Antonieta Pert, MD  latanoprost (XALATAN) 0.005 % ophthalmic solution Place 1 drop into both eyes at bedtime.    [provider]  levothyroxine (SYNTHROID, LEVOTHROID) 25 MCG tablet Take 25 mcg by mouth daily before breakfast.    [provider]  memantine (NAMENDA) 10 MG tablet TAKE ONE TABLET BY MOUTH TWICE DAILY Patient taking differently: Take 10 mg by mouth 2 (two) times daily.  02/28/19   Dohmeier, Asencion [place], MD  pravastatin (PRAVACHOL) 20 MG tablet Take 1 tablet (20 mg total) by mouth every evening. 10/12/18 01/10/19  Park Liter, MD  Past Surgical History Past Surgical History:  Procedure Laterality Date  . APPENDECTOMY     childhood  . arthroscopic surgery knee  1991  . CATARACT EXTRACTION  07/2013  . DILATION AND CURETTAGE OF UTERUS  1990  . GLAUCOMA SURGERY  07/2013  . TUBAL LIGATION  1977   Family History Family History  Problem Relation Age of Onset  . Colon cancer Mother   . Congestive Heart Failure Mother   . Cancer Sister   . Cancer Brother   . Heart Problems Maternal Grandfather   . Heart Problems Maternal Grandmother   . Cancer Other        paternal siblings  . Cancer Brother     Social History Social History   Tobacco Use  . Smoking status: Former Smoker    Quit date: 09/06/1971    Years since quitting: 47.9  . Smokeless tobacco: Never Used  . Tobacco comment: 1970  Substance Use Topics  . Alcohol use: Yes    Alcohol/week: 0.0 standard  drinks    Comment: 3-4 glasses of wine/ wk  . Drug use: No   Allergies Patient has no known allergies.  Review of Systems Review of Systems  Unable to perform ROS: Patient unresponsive    Physical Exam Vital Signs  I have reviewed the triage vital signs There were no vitals taken for this visit.  Physical Exam Vitals reviewed.  Constitutional:      Appearance: She is well-developed.  HENT:     Head: Normocephalic and atraumatic.     Right Ear: External ear normal.     Left Ear: External ear normal.  Eyes:     Conjunctiva/sclera: Conjunctivae normal.  Neck:     Trachea: Phonation normal.  Cardiovascular:     Comments: asystolic Pulmonary:     Comments: apneic Abdominal:     General: There is no distension.  Skin:    General: Skin is cool.     Coloration: Skin is pale.  Neurological:     Comments: Unresponsive     ED Results and Treatments Labs (all labs ordered are listed, but only abnormal results are displayed) Labs Reviewed - No data to display                                                                                                                       EKG  EKG Interpretation  Date/Time:    Ventricular Rate:    PR Interval:    QRS Duration:   QT Interval:    QTC Calculation:   R Axis:     Text Interpretation:        Radiology No results found.  Pertinent labs & imaging results that were available during my care of the patient were reviewed by me and considered in my medical decision making (see chart for details).  Medications Ordered in ED Medications - No data to display  Procedures Procedures  (including critical care time)  Medical Decision Making / ED Course I have reviewed the nursing notes for this encounter and the patient's prior records (if available in EHR or on provided paperwork).   Iyahna Langsam was evaluated in Emergency Department on 08/06/2019 for the symptoms described in the history of present illness. She was evaluated in the context of the global COVID-19 pandemic, which necessitated consideration that the patient might be at risk for infection with the SARS-CoV-2 virus that causes COVID-19. Institutional protocols and algorithms that pertain to the evaluation of patients at risk for COVID-19 are in a state of rapid change based on information released by regulatory bodies including the CDC and federal and state organizations. These policies and algorithms were followed during the patient's care in the ED.  Confirmed expiration upon arrival. Son updated.       Final Clinical Impression(s) / ED Diagnoses Final diagnoses:  Cardiopulmonary arrest [facility])      This chart was dictated using voice recognition software.  Despite best efforts to proofread,  errors can occur which can change the documentation meaning.   Fatima Blank, MD 09/05/2019 2340

## 2019-09-06 DEATH — deceased

## 2020-10-13 IMAGING — DX DG CHEST 1V PORT
1 series · 1 of 1 positions shown · non-contrast
Comparison: 10/09/2018

CLINICAL DATA: Suspected sepsis

EXAM:
PORTABLE CHEST 1 VIEW

[chest ap]
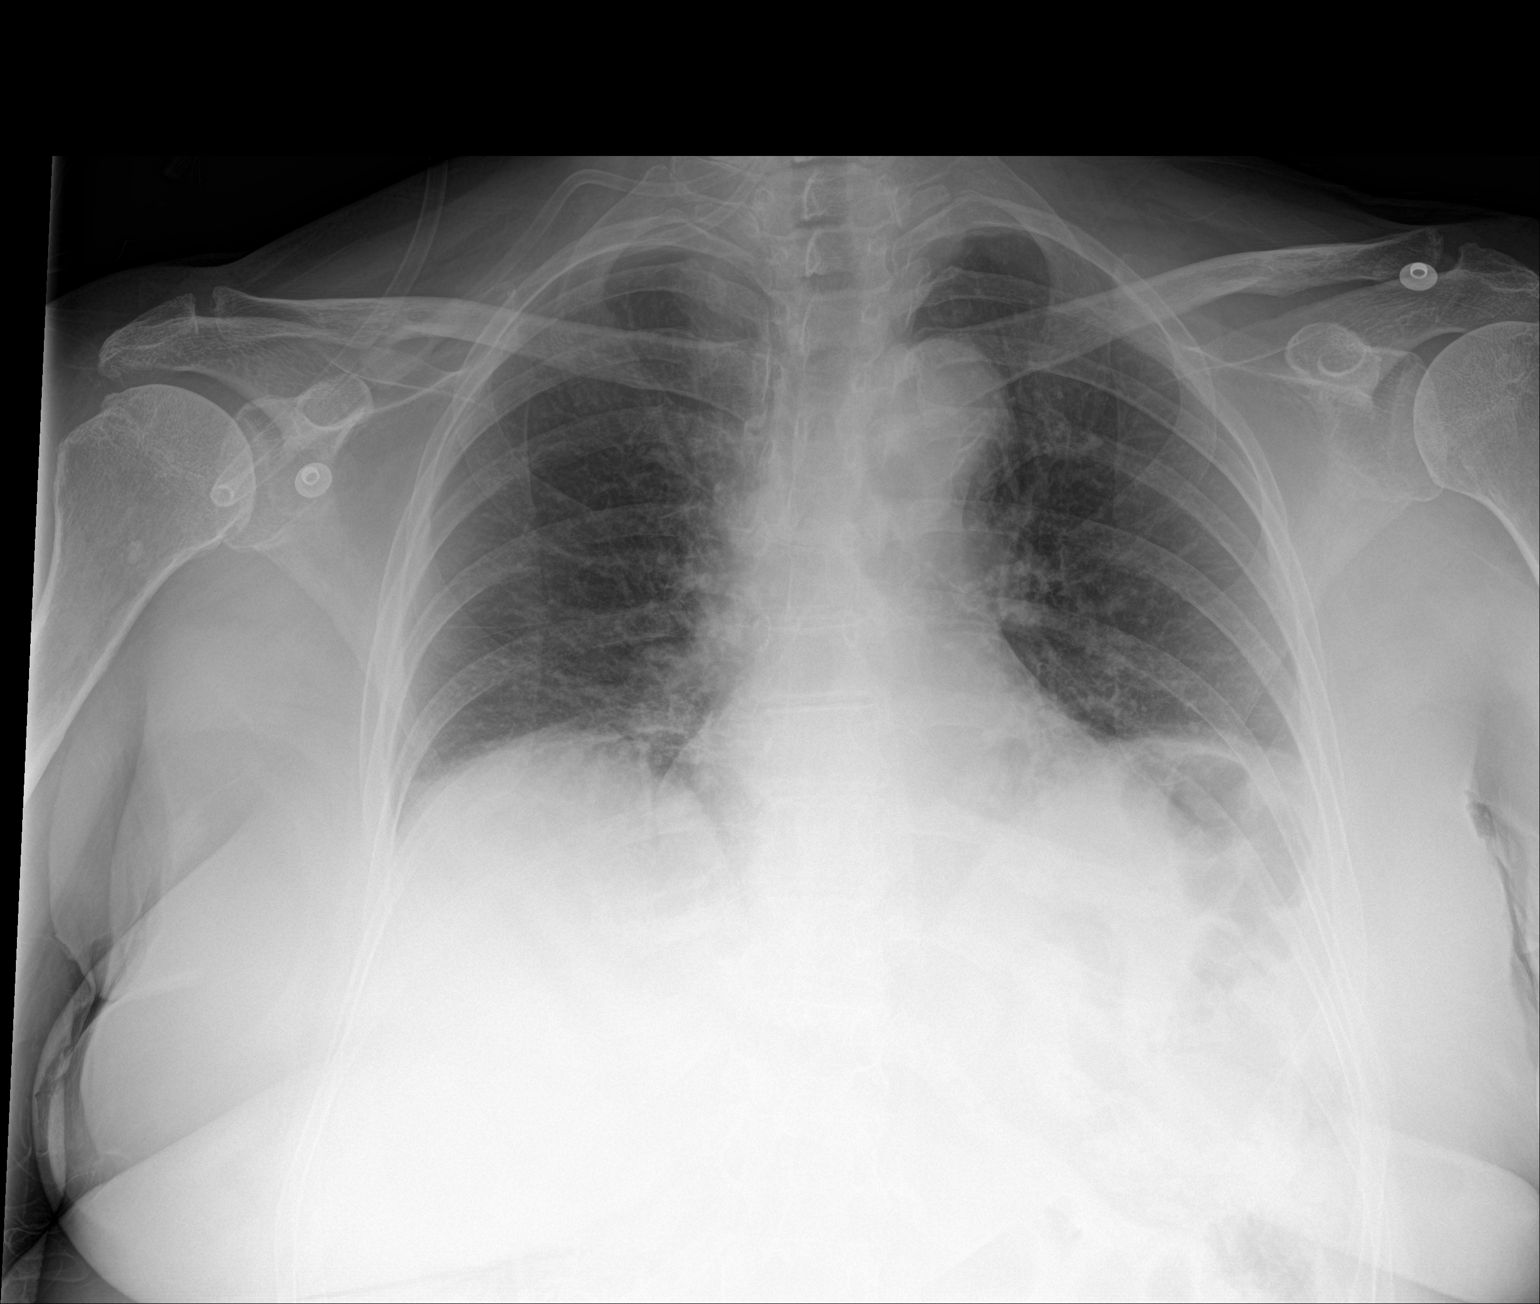

[1 of 1 positions shown; findings below may reference images not displayed]

FINDINGS: Bibasilar atelectasis. Low lung volumes. Heart is normal size. No
effusions or pneumothorax. No acute bony abnormality.
IMPRESSION: Low volumes with bibasilar atelectasis.

## 2021-09-05 DEATH — deceased
